# Patient Record
Sex: Female | Born: 1965 | Race: White | Hispanic: No | Marital: Married | Smoking: Former smoker
Health system: Southern US, Community
[De-identification: ages and names within clinical notes are randomized; demographics above are authoritative.]

## PROBLEM LIST (undated history)

## (undated) DIAGNOSIS — F32A Depression, unspecified: Secondary | ICD-10-CM

## (undated) DIAGNOSIS — S63642A Sprain of metacarpophalangeal joint of left thumb, initial encounter: Secondary | ICD-10-CM

## (undated) DIAGNOSIS — M549 Dorsalgia, unspecified: Secondary | ICD-10-CM

## (undated) DIAGNOSIS — K219 Gastro-esophageal reflux disease without esophagitis: Secondary | ICD-10-CM

## (undated) DIAGNOSIS — G8929 Other chronic pain: Secondary | ICD-10-CM

## (undated) DIAGNOSIS — C50911 Malignant neoplasm of unspecified site of right female breast: Secondary | ICD-10-CM

## (undated) DIAGNOSIS — F329 Major depressive disorder, single episode, unspecified: Secondary | ICD-10-CM

## (undated) HISTORY — PX: BACK SURGERY: SHX140

## (undated) HISTORY — PX: ABDOMINAL HYSTERECTOMY: SHX81

## (undated) HISTORY — PX: BREAST SURGERY: SHX581

## (undated) HISTORY — DX: Malignant neoplasm of unspecified site of right female breast: C50.911

## (undated) HISTORY — PX: TUBAL LIGATION: SHX77

---

## 2011-03-10 ENCOUNTER — Other Ambulatory Visit: Payer: Self-pay | Admitting: Oncology

## 2011-03-10 ENCOUNTER — Encounter (HOSPITAL_BASED_OUTPATIENT_CLINIC_OR_DEPARTMENT_OTHER): Payer: BC Managed Care – PPO | Admitting: Oncology

## 2011-03-10 ENCOUNTER — Encounter: Payer: Self-pay | Admitting: Oncology

## 2011-03-10 DIAGNOSIS — C50919 Malignant neoplasm of unspecified site of unspecified female breast: Secondary | ICD-10-CM

## 2011-03-10 LAB — COMPREHENSIVE METABOLIC PANEL
AST: 20 U/L (ref 0–37)
Albumin: 3.9 g/dL (ref 3.5–5.2)
Alkaline Phosphatase: 56 U/L (ref 39–117)
Glucose, Bld: 110 mg/dL — ABNORMAL HIGH (ref 70–99)
Potassium: 3.5 mEq/L (ref 3.5–5.3)
Sodium: 140 mEq/L (ref 135–145)
Total Bilirubin: 0.1 mg/dL — ABNORMAL LOW (ref 0.3–1.2)
Total Protein: 7.2 g/dL (ref 6.0–8.3)

## 2011-03-10 LAB — CBC WITH DIFFERENTIAL/PLATELET
EOS%: 1.7 % (ref 0.0–7.0)
Eosinophils Absolute: 0.1 10*3/uL (ref 0.0–0.5)
LYMPH%: 24.3 % (ref 14.0–49.7)
MCH: 29.6 pg (ref 25.1–34.0)
MCHC: 33.5 g/dL (ref 31.5–36.0)
MCV: 88.1 fL (ref 79.5–101.0)
MONO%: 6.7 % (ref 0.0–14.0)
NEUT#: 4.3 10*3/uL (ref 1.5–6.5)
Platelets: 238 10*3/uL (ref 145–400)
RBC: 4.28 10*6/uL (ref 3.70–5.45)
RDW: 13.1 % (ref 11.2–14.5)

## 2011-03-11 ENCOUNTER — Other Ambulatory Visit: Payer: Self-pay | Admitting: Oncology

## 2011-03-11 DIAGNOSIS — Z853 Personal history of malignant neoplasm of breast: Secondary | ICD-10-CM

## 2011-03-11 DIAGNOSIS — C50919 Malignant neoplasm of unspecified site of unspecified female breast: Secondary | ICD-10-CM

## 2011-03-15 ENCOUNTER — Ambulatory Visit (HOSPITAL_COMMUNITY)
Admission: RE | Admit: 2011-03-15 | Discharge: 2011-03-15 | Disposition: A | Discharge status: Home / Self Care | Payer: BC Managed Care – PPO | Source: Ambulatory Visit | Attending: Oncology | Admitting: Oncology

## 2011-03-15 DIAGNOSIS — J701 Chronic and other pulmonary manifestations due to radiation: Secondary | ICD-10-CM | POA: Insufficient documentation

## 2011-03-15 DIAGNOSIS — C50919 Malignant neoplasm of unspecified site of unspecified female breast: Secondary | ICD-10-CM | POA: Insufficient documentation

## 2011-03-15 DIAGNOSIS — K769 Liver disease, unspecified: Secondary | ICD-10-CM | POA: Insufficient documentation

## 2011-03-15 DIAGNOSIS — Y842 Radiological procedure and radiotherapy as the cause of abnormal reaction of the patient, or of later complication, without mention of misadventure at the time of the procedure: Secondary | ICD-10-CM | POA: Insufficient documentation

## 2011-03-15 MED ORDER — IOHEXOL 300 MG/ML  SOLN
80.0000 mL | Freq: Once | INTRAMUSCULAR | Status: AC | PRN
  Administered 2011-03-15: 80 mL via INTRAVENOUS

## 2011-03-18 ENCOUNTER — Other Ambulatory Visit: Payer: Self-pay | Admitting: Oncology

## 2011-03-18 ENCOUNTER — Telehealth: Payer: Self-pay | Admitting: *Deleted

## 2011-03-18 DIAGNOSIS — C50919 Malignant neoplasm of unspecified site of unspecified female breast: Secondary | ICD-10-CM

## 2011-03-22 ENCOUNTER — Encounter: Payer: Self-pay | Admitting: Physician Assistant

## 2011-03-22 ENCOUNTER — Telehealth: Payer: Self-pay | Admitting: Oncology

## 2011-03-22 ENCOUNTER — Ambulatory Visit (HOSPITAL_BASED_OUTPATIENT_CLINIC_OR_DEPARTMENT_OTHER): Payer: BC Managed Care – PPO | Admitting: Physician Assistant

## 2011-03-22 DIAGNOSIS — Z170 Estrogen receptor positive status [ER+]: Secondary | ICD-10-CM

## 2011-03-22 DIAGNOSIS — C50919 Malignant neoplasm of unspecified site of unspecified female breast: Secondary | ICD-10-CM

## 2011-03-22 DIAGNOSIS — C50911 Malignant neoplasm of unspecified site of right female breast: Secondary | ICD-10-CM

## 2011-03-22 DIAGNOSIS — K7689 Other specified diseases of liver: Secondary | ICD-10-CM

## 2011-03-22 DIAGNOSIS — N200 Calculus of kidney: Secondary | ICD-10-CM | POA: Insufficient documentation

## 2011-03-22 HISTORY — DX: Malignant neoplasm of unspecified site of right female breast: C50.911

## 2011-03-22 NOTE — Telephone Encounter (Signed)
Gv pt appt for March 2013

## 2011-03-22 NOTE — Progress Notes (Signed)
Progress note dictated-CTS 

## 2011-03-23 NOTE — Progress Notes (Deleted)
CC:   Ancil Boozer, MD Nancy Marus, MD, Fax [phone]  DIAGNOSIS:  A 45 year old [place], [place] woman with a history of a T1a NX invasive mammary carcinoma of the right breast, S/P right lumpectomy February of 2012 which revealed an ER/PR positive, HER- 2/neu negative, nuclear grade 2, invasive ductal carcinoma process.  SUBJECTIVE:  She completed radiation therapy in May of 2012 and has been on tamoxifen since.  She was seen by Dr. Pierce Crane on 03/10/2011 for initial establishment of oncologic followup. Due to the fact that he did note some deep palpable nodes within the left supraclavicular fossa, a CT of the chest was obtained which did not reveal any pathology in this particular region, but she was noted to have a 4.2 x 3.4 cm slightly hyperattenuating lesion within the left hepatic lobe.  It was felt to be somewhat exophytic with a central area of low attenuation suggesting a scar, but it was incompletely imaged.  She is scheduled for an MRI of the abdomen on 03/24/2011.  Of note, she is also scheduled for a mammogram on 04/01/2011.  She has appropriate anxiety associated with the upcoming imaging studies but has declined Ativan prescription.  She has had no new medication changes.  REVIEW OF SYSTEMS:  Otherwise negative.  ALLERGIES:  NOTED AS PENICILLIN AND SULFA.  MEDICATIONS:  Medication list has been reviewed and updated.  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  PHYSICAL EXAMINATION:  Vital Signs:  Blood pressure is 114/81, pulse 116, respirations 20, temperature 98.4, weight 190 pounds.  Full exam has been deferred.  LABORATORY DATA:  From 03/10/2011, hemoglobin 12.7 g, platelet count 238,000, WBC 6500.  Chemistry panel within normal limits including a CA 27.29 at 17.  IMPRESSION: 1. A  45 year old [place], [place] woman with a history of an     ER/PR positive, HER-2/neu negative, nuclear grade 2, T1a NX     infiltrating ductal carcinoma of the right breast for which she     underwent a right lumpectomy followed by radiation therapy     completed May of 2012 on tamoxifen 20 mg p.o. daily since. 2. Questionable finding on the left hepatic lobe suggesting scar     versus other process.  Upcoming MRI of the abdomen scheduled for     03/24/2011. The case has been reviewed with Dr. Donnie Coffin.  PLAN:  Caryssa will keep her appointment as scheduled for her MRI of the abdomen.  From our standpoint, if this is clear, we would plan on seeing her back in 4 months' time for routine followup, including a CBC, C-Met, LDH and CA 27.29.  The patient understands and agrees with this plan.    ______________________________ Sharyl Nimrod, PA CS/MEDQ  D:  03/22/2011  T:  03/22/2011  Job:  [id number]

## 2011-03-24 ENCOUNTER — Encounter: Payer: Self-pay | Admitting: Oncology

## 2011-03-24 ENCOUNTER — Ambulatory Visit (HOSPITAL_COMMUNITY)
Admission: RE | Admit: 2011-03-24 | Discharge: 2011-03-24 | Disposition: A | Discharge status: Home / Self Care | Payer: BC Managed Care – PPO | Source: Ambulatory Visit | Attending: Oncology | Admitting: Oncology

## 2011-03-24 ENCOUNTER — Telehealth: Payer: Self-pay | Admitting: Oncology

## 2011-03-24 DIAGNOSIS — K7689 Other specified diseases of liver: Secondary | ICD-10-CM | POA: Insufficient documentation

## 2011-03-24 DIAGNOSIS — C50919 Malignant neoplasm of unspecified site of unspecified female breast: Secondary | ICD-10-CM | POA: Insufficient documentation

## 2011-03-24 MED ORDER — GADOXETATE DISODIUM 0.25 MMOL/ML IV SOLN
8.0000 mL | Freq: Once | INTRAVENOUS | Status: AC | PRN
  Administered 2011-03-24: 8 mL via INTRAVENOUS

## 2011-03-24 NOTE — Telephone Encounter (Signed)
Pt called wanting results of MRI  RN informed pt results are negative per Sharyl Nimrod PA

## 2011-03-26 NOTE — Progress Notes (Deleted)
CC:   Donna Alm, MD W. Edward Wengler, MD, Fax [phone]  DIAGNOSIS:  A 45-year-old [place], [place] woman with a history of a T1a NX invasive mammary carcinoma of the right breast, S/P right lumpectomy February of 2012 which revealed an ER/PR positive, HER- 2/neu negative, nuclear grade 2, invasive ductal carcinoma process.  SUBJECTIVE:  She completed radiation therapy in May of 2012 and has been on tamoxifen since.  She was seen by Dr. Peter Garrison on 03/10/2011 for initial establishment of oncologic followup. Due to the fact that he did note some deep palpable nodes within the left supraclavicular fossa, a CT of the chest was obtained which did not reveal any pathology in this particular region, but she was noted to have a 4.2 x 3.4 cm slightly hyperattenuating lesion within the left hepatic lobe.  It was felt to be somewhat exophytic with a central area of low attenuation suggesting a scar, but it was incompletely imaged.  She is scheduled for an MRI of the abdomen on 03/24/2011.  Of note, she is also scheduled for a mammogram on 04/01/2011.  She has appropriate anxiety associated with the upcoming imaging studies but has declined Ativan prescription.  She has had no new medication changes.  REVIEW OF SYSTEMS:  Otherwise negative.  ALLERGIES:  NOTED AS PENICILLIN AND SULFA.  MEDICATIONS:  Medication list has been reviewed and updated.  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  PHYSICAL EXAMINATION:  Vital Signs:  Blood pressure is 114/81, pulse 116, respirations 20, temperature 98.4, weight 190 pounds.  Full exam has been deferred.  LABORATORY DATA:  From 03/10/2011, hemoglobin 12.7 g, platelet count 238,000, WBC 6500.  Chemistry panel within normal limits including a CA 27.29 at 17.  IMPRESSION: 1. A  45-year-old [place], [place] woman with a history of an     ER/PR positive, HER-2/neu negative, nuclear grade 2, T1a NX     infiltrating ductal carcinoma of the right breast for which she     underwent a right lumpectomy followed by radiation therapy     completed May of 2012 on tamoxifen 20 mg p.o. daily since. 2. Questionable finding on the left hepatic lobe suggesting scar     versus other process.  Upcoming MRI of the abdomen scheduled for     03/24/2011. The case has been reviewed with Dr. Rubin.  PLAN:  Donna Garrison will keep her appointment as scheduled for her MRI of the abdomen.  From our standpoint, if this is clear, we would plan on seeing her back in 4 months' time for routine followup, including a CBC, C-Met, LDH and CA 27.29.  The patient understands and agrees with this plan.    ______________________________ Donna Donald, PA CS/MEDQ  D:  03/22/2011  T:  03/22/2011  Job:  [id number] 

## 2011-03-28 ENCOUNTER — Encounter: Payer: Self-pay | Admitting: *Deleted

## 2011-03-28 NOTE — Progress Notes (Signed)
Mailed after appt letter to pt. 

## 2011-03-29 NOTE — Progress Notes (Signed)
CC:   Stephanie Alm, MD W. Edward Wengler, MD, Fax [phone]  DIAGNOSIS:  A 45-year-old Donna Garrison, Donna Garrison with a history of a T1a NX invasive mammary carcinoma of the right breast, S/P right lumpectomy February of 2012 which revealed an ER/PR positive, HER- 2/neu negative, nuclear grade 2, invasive ductal carcinoma process.  SUBJECTIVE:  She completed radiation therapy in May of 2012 and has been on tamoxifen since.  She was seen by Dr. Peter Rubin on 03/10/2011 for initial establishment of oncologic followup. Due to the fact that he did note some deep palpable nodes within the left supraclavicular fossa, a CT of the chest was obtained which did not reveal any pathology in this particular region, but she was noted to have a 4.2 x 3.4 cm slightly hyperattenuating lesion within the left hepatic lobe.  It was felt to be somewhat exophytic with a central area of low attenuation suggesting a scar, but it was incompletely imaged.  She is scheduled for an MRI of the abdomen on 03/24/2011.  Of note, she is also scheduled for a mammogram on 04/01/2011.  She has appropriate anxiety associated with the upcoming imaging studies but has declined Ativan prescription.  She has had no new medication changes.  REVIEW OF SYSTEMS:  Otherwise negative.  ALLERGIES:  NOTED AS PENICILLIN AND SULFA.  MEDICATIONS:  Medication list has been reviewed and updated.  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  PHYSICAL EXAMINATION:  Vital Signs:  Blood pressure is 114/81, pulse 116, respirations 20, temperature 98.4, weight 190 pounds.  Full exam has been deferred.  LABORATORY DATA:  From 03/10/2011, hemoglobin 12.7 g, platelet count 238,000, WBC 6500.  Chemistry panel within normal limits including a CA 27.29 at 17.  IMPRESSION: 1. A  45-year-old , Donna Garrison with a history of an     ER/PR positive, HER-2/neu negative, nuclear grade 2, T1a NX     infiltrating ductal carcinoma of the right breast for which she     underwent a right lumpectomy followed by radiation therapy     completed May of 2012 on tamoxifen 20 mg p.o. daily since. 2. Questionable finding on the left hepatic lobe suggesting scar     versus other process.  Upcoming MRI of the abdomen scheduled for     03/24/2011. The case has been reviewed with Dr. Rubin.  PLAN:  Lititia will keep her appointment as scheduled for her MRI of the abdomen.  From our standpoint, if this is clear, we would plan on seeing her back in 4 months' time for routine followup, including a CBC, C-Met, LDH and CA 27.29.  The patient understands and agrees with this plan.    ______________________________ Arianne Klinge, PA CS/MEDQ  D:  03/22/2011  T:  03/22/2011  Job:  [id number] 

## 2011-04-01 ENCOUNTER — Ambulatory Visit
Admission: RE | Admit: 2011-04-01 | Discharge: 2011-04-01 | Disposition: A | Discharge status: Home / Self Care | Payer: BC Managed Care – PPO | Source: Ambulatory Visit | Attending: Oncology | Admitting: Oncology

## 2011-04-01 DIAGNOSIS — Z853 Personal history of malignant neoplasm of breast: Secondary | ICD-10-CM

## 2011-04-02 ENCOUNTER — Telehealth: Payer: Self-pay | Admitting: *Deleted

## 2011-04-02 NOTE — Telephone Encounter (Signed)
Pt called and left msg that [facility] needed her actual films and she believed Dr Donnie Coffin had them.  Left msg that Dr Donnie Coffin has never had the actual films from her mammogram and she may need to contact the clinic where she had her last mammogram for further instructions

## 2011-05-07 ENCOUNTER — Other Ambulatory Visit (HOSPITAL_COMMUNITY)
Admission: RE | Admit: 2011-05-07 | Discharge: 2011-05-07 | Disposition: A | Discharge status: Home / Self Care | Payer: BC Managed Care – PPO | Source: Ambulatory Visit | Attending: Family Medicine | Admitting: Family Medicine

## 2011-05-07 DIAGNOSIS — Z124 Encounter for screening for malignant neoplasm of cervix: Secondary | ICD-10-CM | POA: Insufficient documentation

## 2011-05-07 DIAGNOSIS — Z1159 Encounter for screening for other viral diseases: Secondary | ICD-10-CM | POA: Insufficient documentation

## 2011-07-20 ENCOUNTER — Other Ambulatory Visit: Payer: BC Managed Care – PPO | Admitting: Lab

## 2011-07-20 ENCOUNTER — Ambulatory Visit: Payer: BC Managed Care – PPO | Admitting: Oncology

## 2012-02-23 ENCOUNTER — Other Ambulatory Visit: Payer: Self-pay | Admitting: *Deleted

## 2012-02-23 MED ORDER — TAMOXIFEN CITRATE 20 MG PO TABS: 20.0000 mg | ORAL_TABLET | Freq: Every day | ORAL | Status: DC

## 2012-02-23 NOTE — Telephone Encounter (Signed)
Pt requests refill on tamoxifen for 1 month until she sees her her new provider 03/15/12 near her home in [place]. A 1 month refill has been called into [facility] in  [place],

## 2012-08-26 ENCOUNTER — Emergency Department (HOSPITAL_COMMUNITY)
Admission: EM | Admit: 2012-08-26 | Discharge: 2012-08-26 | Disposition: A | Discharge status: Home / Self Care | Payer: Private Health Insurance - Indemnity | Attending: Emergency Medicine | Admitting: Emergency Medicine

## 2012-08-26 ENCOUNTER — Encounter (HOSPITAL_COMMUNITY): Payer: Self-pay | Admitting: *Deleted

## 2012-08-26 ENCOUNTER — Emergency Department (HOSPITAL_COMMUNITY): Payer: Private Health Insurance - Indemnity

## 2012-08-26 DIAGNOSIS — S3991XA Unspecified injury of abdomen, initial encounter: Secondary | ICD-10-CM

## 2012-08-26 DIAGNOSIS — S3981XA Other specified injuries of abdomen, initial encounter: Secondary | ICD-10-CM | POA: Insufficient documentation

## 2012-08-26 DIAGNOSIS — Z79899 Other long term (current) drug therapy: Secondary | ICD-10-CM | POA: Insufficient documentation

## 2012-08-26 DIAGNOSIS — Y929 Unspecified place or not applicable: Secondary | ICD-10-CM | POA: Insufficient documentation

## 2012-08-26 DIAGNOSIS — Z853 Personal history of malignant neoplasm of breast: Secondary | ICD-10-CM | POA: Insufficient documentation

## 2012-08-26 DIAGNOSIS — W64XXXA Exposure to other animate mechanical forces, initial encounter: Secondary | ICD-10-CM | POA: Insufficient documentation

## 2012-08-26 DIAGNOSIS — Y939 Activity, unspecified: Secondary | ICD-10-CM | POA: Insufficient documentation

## 2012-08-26 LAB — COMPREHENSIVE METABOLIC PANEL
AST: 27 U/L (ref 0–37)
Albumin: 4.3 g/dL (ref 3.5–5.2)
Chloride: 101 mEq/L (ref 96–112)
Creatinine, Ser: 1.03 mg/dL (ref 0.50–1.10)
Total Bilirubin: 0.2 mg/dL — ABNORMAL LOW (ref 0.3–1.2)
Total Protein: 7.5 g/dL (ref 6.0–8.3)

## 2012-08-26 LAB — CBC
MCV: 88.2 fL (ref 78.0–100.0)
Platelets: 235 10*3/uL (ref 150–400)
RDW: 13.1 % (ref 11.5–15.5)
WBC: 10.2 10*3/uL (ref 4.0–10.5)

## 2012-08-26 MED ORDER — ONDANSETRON 8 MG PO TBDP: 8.0000 mg | ORAL_TABLET | Freq: Three times a day (TID) | ORAL | Status: DC | PRN

## 2012-08-26 MED ORDER — MORPHINE SULFATE 4 MG/ML IJ SOLN: 6.0000 mg | Freq: Once | INTRAMUSCULAR | Status: DC

## 2012-08-26 MED ORDER — IOHEXOL 300 MG/ML  SOLN
100.0000 mL | Freq: Once | INTRAMUSCULAR | Status: AC | PRN
  Administered 2012-08-26: 100 mL via INTRAVENOUS

## 2012-08-26 MED ORDER — HYDROMORPHONE HCL PF 1 MG/ML IJ SOLN
1.0000 mg | Freq: Once | INTRAMUSCULAR | Status: AC
  Administered 2012-08-26: 1 mg via INTRAVENOUS
  Filled 2012-08-26: qty 1

## 2012-08-26 MED ORDER — OXYCODONE-ACETAMINOPHEN 5-325 MG PO TABS
1.0000 | ORAL_TABLET | ORAL | Status: DC | PRN
Start: 2012-08-26 — End: 2017-07-05

## 2012-08-26 NOTE — ED Notes (Signed)
Pt states she was kicked in the stomach by a horse. C/o rib pain denies any other

## 2012-08-26 NOTE — ED Notes (Signed)
Pt given glass of water, dr Patria Mane states OK

## 2012-08-26 NOTE — ED Notes (Signed)
Pt was kicked in upper abdomen and lower ribs by a horse approx 1 hour ago. Complains of severe abd pain, slight mark noted to abdomen, very tender to palpation, no obvious distension noted. Pt waiting on CT at this time. AAx4

## 2012-08-26 NOTE — ED Notes (Signed)
Pt states pain is back at this time, 1mg  dilaudid per verbal order by dr Patria Mane.

## 2012-08-26 NOTE — ED Provider Notes (Signed)
History     CSN: [id number]  Arrival date & time 08/26/12  2026   First MD Initiated Contact with Patient 08/26/12 2033      Chief Complaint  Patient presents with  . Rib Injury     HPI Patient reports she was kicked in the upper abdomen by her horse about 30-40 minutes ago.  She reports some discomfort in her chest however the majority of pain is located in her upper abdomen.  No nausea or vomiting.  Her pain at this time is severe and seems to be worsening.  No lightheadedness or weakness.  No anticoagulant use.  No shortness of breath.    Past Medical History  Diagnosis Date  . Breast cancer, right breast 03/22/2011    Past Surgical History  Procedure Laterality Date  . Back surgery      Lumbar Laminectomy L5-S1 2008    Family History  Problem Relation Age of Onset  . Hypertension Mother   . Celiac disease Father   . Celiac disease Sister     History  Substance Use Topics  . Smoking status: Never Smoker   . Smokeless tobacco: Not on file  . Alcohol Use: Yes     Comment: occasional    OB History   Grav Para Term Preterm Abortions TAB SAB Ect Mult Living                  Review of Systems  All other systems reviewed and are negative.    Allergies  Penicillins and Sulfa antibiotics  Home Medications   Current Outpatient Rx  Name  Route  Sig  Dispense  Refill  . cyclobenzaprine (FLEXERIL) 10 MG tablet   Oral   Take 10 mg by mouth 3 (three) times daily as needed.           . diclofenac (VOLTAREN) 0.1 % ophthalmic solution      1 drop 4 (four) times daily. Voltaren XR 100mg  po PRN          . DULoxetine (CYMBALTA) 60 MG capsule   Oral   Take 60 mg by mouth daily.           Marland Kitchen loratadine (CLARITIN) 10 MG tablet   Oral   Take 10 mg by mouth daily.           . metaxalone (SKELAXIN) 800 MG tablet   Oral   Take 800 mg by mouth 3 (three) times daily as needed.           . tamoxifen (NOLVADEX) 20 MG tablet   Oral   Take 1 tablet (20 mg  total) by mouth daily.   30 tablet   0     BP 137/89  Pulse 97  Temp(Src) 97.5 F (36.4 C) (Oral)  Resp 20  Ht 5\' 6"  (1.676 m)  Wt 190 lb (86.183 kg)  BMI 30.68 kg/m2  SpO2 100%  LMP 08/17/2012  Physical Exam  Nursing note and vitals reviewed. Constitutional: She is oriented to person, place, and time. She appears well-developed and well-nourished. No distress.  HENT:  Head: Normocephalic and atraumatic.  Eyes: EOM are normal.  Neck: Normal range of motion.  Cardiovascular: Normal rate, regular rhythm and normal heart sounds.   Pulmonary/Chest: Effort normal and breath sounds normal.  Abdominal: Soft. She exhibits no distension.  Epigastric and upper abdominal tenderness without rebound.  Patient does have some guarding.  Musculoskeletal: Normal range of motion.  Neurological: She is alert and oriented  to person, place, and time.  Skin: Skin is warm and dry.  Psychiatric: She has a normal mood and affect. Judgment normal.    ED Course  Procedures (including critical care time)  Labs Reviewed  COMPREHENSIVE METABOLIC PANEL - Abnormal; Notable for the following:    Total Bilirubin 0.2 (*)    GFR calc non Af Amer 64 (*)    GFR calc Af Amer 74 (*)    All other components within normal limits  CBC  LIPASE, BLOOD   Ct Abdomen Pelvis W Contrast  08/26/2012  *RADIOLOGY REPORT*  Clinical Data: 46 year old female with sternal pain after being kicked by horse.  Rib injury.  History of right side breast cancer.  CT ABDOMEN AND PELVIS WITH CONTRAST  Technique:  Multidetector CT imaging of the abdomen and pelvis was performed following the standard protocol during bolus administration of intravenous contrast.  Contrast: OMNIPAQUE IOHEXOL 300 MG/ML  SOLN  Comparison: Abdomen MRI 03/24/2011.  Findings: No pneumothorax or pleural effusion.  No pericardial effusion.  Normal lung volumes; minimal dependent atelectasis.  No confluent opacities to suggest pulmonary contusion.   Visualized lower ribs appear intact.  There is a subtle area of subcutaneous fat stranding in the midline on series 2 image 20 at the anterior level of the diaphragm compatible with a post traumatic contusion.  Tip of the sternum and xyphoid appear within normal limits.  No acute osseous abnormality identified.  Chronic L5-S1 disc degeneration with vacuum disc phenomena.  No pelvic free fluid.  Negative rectum.  Pelvic phleboliths. Uterus and adnexa within normal limits.  Diminutive bladder. Redundant sigmoid colon.  Redundant descending colon and splenic flexure.  Transverse and right colon within normal limits.  Normal appendix.  Negative terminal ileum.  No dilated small bowel.  No pneumoperitoneum.  Negative stomach and duodenum.  Stable liver with 4 cm diameter largely iso- dense area adjacent to the gallbladder fossa felt to reflect benign S and a H on prior study (series 2 image 24).  No perihepatic fluid.  No evidence of liver laceration / injury.  Negative gallbladder, spleen, pancreas, adrenal glands, and portal venous system.  Major arterial structures in the abdomen and pelvis appear normal.  No abdominal free fluid.  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  No abdominal lymphadenopathy.  IMPRESSION: 1.  Mild ventral abdominal contusion in the midline below the xyphoid.  No rib fracture, traumatic injury to the lower chest, or visceral injury in the abdomen or pelvis identified. 2.  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   Original Report Authenticated By: Erskine Speed, M.D.    Dg Chest Portable 1 View  08/26/2012  *RADIOLOGY REPORT*  Clinical Data: 48 year old female status post blunt trauma.  Rib pain.  History of right breast cancer.  PORTABLE CHEST - 1 VIEW  Comparison: Chest CT 03/15/2011.  Findings: Portable AP upright view 2043 hours.  Lower lung volumes. No pneumothorax or pneumoperitoneum evident.  Cardiac size and mediastinal contours are  within normal limits.  Visualized tracheal air column is within normal limits.  Surgical clips at the right axilla.  No pleural effusion, pulmonary contusion or confluent pulmonary opacity identified.  No displaced rib fracture identified.  IMPRESSION: No acute cardiopulmonary abnormality or acute traumatic injury identified.   Original Report Authenticated By: Erskine Speed, M.D.    I personally reviewed the imaging tests through PACS system I reviewed available ER/hospitalization records through the EMR   No diagnosis  found.    MDM  8:44 PM Patient will undergo portable chest and a stat CT abdomen and pelvis to evaluate for solid organ injury.  Vitals stable this time.  N.p.o.  Pain treated.  10:03 PM Patient feels much better at this time.  CT scan is normal.  No free fluid or anything else to suggest small bowel injury.  Specifically read as no visceral injury.  I talked with the patient and her husband at length regarding her abdominal pain.  Seems to significantly be improved at this time.  I will send the patient home with some pain medicine and some nausea medicine.  She's not nauseated this time.  She understands return to the ER for new or worsening symptoms.  She was told and understands that there could be the potential for a very small a cold bowel injury they were not noticing at this time and that if her symptoms were to worsen she will need to return immediately to the ER.  She understands        Lyanne Co, MD 08/26/12 2204

## 2016-09-19 ENCOUNTER — Emergency Department (HOSPITAL_COMMUNITY)
Admission: EM | Admit: 2016-09-19 | Discharge: 2016-09-19 | Disposition: A | Discharge status: Home / Self Care | Payer: BLUE CROSS/BLUE SHIELD | Attending: Emergency Medicine | Admitting: Emergency Medicine

## 2016-09-19 ENCOUNTER — Emergency Department (HOSPITAL_COMMUNITY): Payer: BLUE CROSS/BLUE SHIELD

## 2016-09-19 ENCOUNTER — Encounter (HOSPITAL_COMMUNITY): Payer: Self-pay | Admitting: Emergency Medicine

## 2016-09-19 DIAGNOSIS — M545 Low back pain, unspecified: Secondary | ICD-10-CM

## 2016-09-19 DIAGNOSIS — Y929 Unspecified place or not applicable: Secondary | ICD-10-CM | POA: Diagnosis not present

## 2016-09-19 DIAGNOSIS — F1290 Cannabis use, unspecified, uncomplicated: Secondary | ICD-10-CM | POA: Insufficient documentation

## 2016-09-19 DIAGNOSIS — Z853 Personal history of malignant neoplasm of breast: Secondary | ICD-10-CM | POA: Insufficient documentation

## 2016-09-19 DIAGNOSIS — Y939 Activity, unspecified: Secondary | ICD-10-CM | POA: Diagnosis not present

## 2016-09-19 DIAGNOSIS — Z79899 Other long term (current) drug therapy: Secondary | ICD-10-CM | POA: Diagnosis not present

## 2016-09-19 DIAGNOSIS — Y999 Unspecified external cause status: Secondary | ICD-10-CM | POA: Diagnosis not present

## 2016-09-19 DIAGNOSIS — S7012XA Contusion of left thigh, initial encounter: Secondary | ICD-10-CM | POA: Diagnosis not present

## 2016-09-19 DIAGNOSIS — Z87891 Personal history of nicotine dependence: Secondary | ICD-10-CM | POA: Insufficient documentation

## 2016-09-19 DIAGNOSIS — W19XXXA Unspecified fall, initial encounter: Secondary | ICD-10-CM

## 2016-09-19 DIAGNOSIS — S60212A Contusion of left wrist, initial encounter: Secondary | ICD-10-CM | POA: Insufficient documentation

## 2016-09-19 DIAGNOSIS — S3992XA Unspecified injury of lower back, initial encounter: Secondary | ICD-10-CM | POA: Diagnosis present

## 2016-09-19 HISTORY — DX: Depression, unspecified: F32.A

## 2016-09-19 HISTORY — DX: Major depressive disorder, single episode, unspecified: F32.9

## 2016-09-19 MED ORDER — METHOCARBAMOL 500 MG PO TABS: 500.0000 mg | ORAL_TABLET | Freq: Two times a day (BID) | ORAL | 0 refills | Status: DC | PRN

## 2016-09-19 MED ORDER — ONDANSETRON HCL 4 MG/2ML IJ SOLN
4.0000 mg | Freq: Once | INTRAMUSCULAR | Status: AC
  Administered 2016-09-19: 4 mg via INTRAVENOUS
  Filled 2016-09-19: qty 2

## 2016-09-19 MED ORDER — HYDROMORPHONE HCL 1 MG/ML IJ SOLN
1.0000 mg | Freq: Once | INTRAMUSCULAR | Status: AC
  Administered 2016-09-19: 1 mg via INTRAVENOUS
  Filled 2016-09-19: qty 1

## 2016-09-19 MED ORDER — IBUPROFEN 800 MG PO TABS: 800.0000 mg | ORAL_TABLET | Freq: Three times a day (TID) | ORAL | 0 refills | Status: DC

## 2016-09-19 NOTE — ED Provider Notes (Signed)
[facility] DEPT Provider Note   CSN: [id number] Arrival date & time: 09/19/16  0402     History   Chief Complaint Chief Complaint  Patient presents with  . Fall    HPI Donna Garrison is a 51 y.o. female.  Patient with severe right hip and low back pain after being thrown from horse around 8 PM last night. Denies losing consciousness but may have hit head. She does not take any blood thinners. History of back surgery remotely. Complains of severe midline low back pain and pain in her right hip. Took Percocet at home with no relief. Denies any numbness or tingling. Denies any incontinence. Denies any focal weakness. Denies any neck pain, chest pain, abdominal pain or shortness of breath. He was able to ambulate with assistance. Pain is severe in the midline of her back as well as across her pelvis and right hip.   The history is provided by the patient and a relative.  Fall  Pertinent negatives include no chest pain, no abdominal pain, no headaches and no shortness of breath.    Past Medical History:  Diagnosis Date  . Breast cancer, right breast ([place]) 03/22/2011  . Depression     Patient Active Problem List   Diagnosis Date Noted  . Breast cancer, right breast ([place]) 03/22/2011  . Renal calculi 03/22/2011    Past Surgical History:  Procedure Laterality Date  . ABDOMINAL HYSTERECTOMY    . BACK SURGERY     Lumbar Laminectomy L5-S1 2008    OB History    No data available       Home Medications    Prior to Admission medications   Medication Sig Start Date End Date Taking? Authorizing Provider  DULoxetine (CYMBALTA) 60 MG capsule Take 60 mg by mouth daily.     Yes [provider]  fexofenadine (ALLEGRA) 30 MG tablet Take 30 mg by mouth 2 (two) times daily.   Yes [provider]  oxyCODONE-acetaminophen (PERCOCET/ROXICET) 5-325 MG per tablet Take 1 tablet by mouth every 4 (four) hours as needed for pain. 08/26/12  Yes Jola Schmidt, MD  loratadine  (CLARITIN) 10 MG tablet Take 10 mg by mouth daily.      [provider]  ondansetron (ZOFRAN ODT) 8 MG disintegrating tablet Take 1 tablet (8 mg total) by mouth every 8 (eight) hours as needed for nausea. 08/26/12   Jola Schmidt, MD  spironolactone (ALDACTONE) 100 MG tablet Take 100 mg by mouth daily.    [provider]  tamoxifen (NOLVADEX) 20 MG tablet Take 1 tablet (20 mg total) by mouth daily. 02/23/12   Eston Esters, MD    Family History Family History  Problem Relation Age of Onset  . Hypertension Mother   . Celiac disease Father   . Celiac disease Sister     Social History Social History  Substance Use Topics  . Smoking status: Former Smoker    Quit date: 05/17/2000  . Smokeless tobacco: Never Used  . Alcohol use Yes     Comment: occasional     Allergies   Penicillins and Sulfa antibiotics   Review of Systems Review of Systems  Constitutional: Negative for activity change, appetite change and fever.  HENT: Negative for congestion and rhinorrhea.   Respiratory: Negative for cough, chest tightness and shortness of breath.   Cardiovascular: Negative for chest pain.  Gastrointestinal: Negative for abdominal pain, nausea and vomiting.  Genitourinary: Negative for dysuria and hematuria.  Musculoskeletal: Positive for arthralgias, back  pain and myalgias. Negative for neck pain.  Skin: Negative for wound.  Neurological: Negative for dizziness, weakness and headaches.    all other systems are negative except as noted in the HPI and PMH.   Physical Exam Updated Vital Signs BP 126/87   Pulse (!) 105   Temp 98.1 F (36.7 C) (Oral)   Resp 20   Ht 5\' 7"  (1.702 m)   Wt 200 lb (90.7 kg)   SpO2 100%   BMI 31.32 kg/m   Physical Exam  Constitutional: She is oriented to person, place, and time. She appears well-developed and well-nourished. No distress.  uncomfortable  HENT:  Head: Normocephalic and atraumatic.  Mouth/Throat: Oropharynx is clear and  moist. No oropharyngeal exudate.  Eyes: Conjunctivae and EOM are normal. Pupils are equal, round, and reactive to light.  Neck: Normal range of motion. Neck supple.  No C spine tenderness  Cardiovascular: Normal rate, regular rhythm, normal heart sounds and intact distal pulses.   No murmur heard. Pulmonary/Chest: Effort normal and breath sounds normal. No respiratory distress. She exhibits no tenderness.  Abdominal: Soft. There is no tenderness. There is no rebound and no guarding.  Musculoskeletal: Normal range of motion. She exhibits tenderness. She exhibits no deformity.  Midline lumbar tenderness, no stepoff Pain with ROM R hip. No shortening or external rotation. Ecchymosis L medial thigh.  Ecchymosis L palmar wrist without bony tenderness  Neurological: She is alert and oriented to person, place, and time. No cranial nerve deficit. She exhibits normal muscle tone. Coordination normal.   5/5 strength throughout. CN 2-12 intact.Equal grip strength.   Skin: Skin is warm.  Psychiatric: She has a normal mood and affect. Her behavior is normal.  Nursing note and vitals reviewed.    ED Treatments / Results  Labs (all labs ordered are listed, but only abnormal results are displayed) Labs Reviewed - No data to display  EKG  EKG Interpretation None       Radiology No results found.  Procedures Procedures (including critical care time)  Medications Ordered in ED Medications  HYDROmorphone (DILAUDID) injection 1 mg (not administered)  ondansetron (ZOFRAN) injection 4 mg (not administered)     Initial Impression / Assessment and Plan / ED Course  I have reviewed the triage vital signs and the nursing notes.  Pertinent labs & imaging results that were available during my care of the patient were reviewed by me and considered in my medical decision making (see chart for details).     Fall from horse with Low back and R hip pain.  No LOC.  No focal weakness, numbness,  tingling. No incontinence.    Xrays negative. Will proceed with CT imaging of pelvis and lumbar spine.  Pain meds repeated. Pain improving.  Normal strength and sensation in lower extremities.  CT lumbar spine and pelvis pending. Dr. Sabra Heck to assume care at shift change. Final Clinical Impressions(s) / ED Diagnoses   Final diagnoses:  Fall, initial encounter  Acute low back pain without sciatica, unspecified back pain laterality    New Prescriptions New Prescriptions   No medications on file     Ezequiel Essex, MD 09/19/16 2039

## 2016-09-19 NOTE — ED Triage Notes (Signed)
PT was thrown from horse around 2000 on 09/18/2016.  Having severe pain in lower back and right hip

## 2016-09-19 NOTE — Discharge Instructions (Signed)
Bedrest for no more than 48 hours Gradually mobilize Motrin 800mg  every 8 hours as needed for pain Robaxin for muscle relaxant See your doctor for recheck if no better in 1 week.

## 2016-09-19 NOTE — ED Provider Notes (Signed)
Pt doing better - informed of results - stable for d/c Expressed understanding to results and treatment plan Both she and husband made aware Copy of CT results given to family.   Noemi Chapel, MD 09/19/16 [phone]

## 2016-10-04 ENCOUNTER — Other Ambulatory Visit (HOSPITAL_COMMUNITY): Payer: Self-pay | Admitting: Physician Assistant

## 2016-10-04 ENCOUNTER — Ambulatory Visit (HOSPITAL_COMMUNITY)
Admission: RE | Admit: 2016-10-04 | Discharge: 2016-10-04 | Disposition: A | Discharge status: Home / Self Care | Payer: BLUE CROSS/BLUE SHIELD | Source: Ambulatory Visit | Attending: Physician Assistant | Admitting: Physician Assistant

## 2016-10-04 DIAGNOSIS — M25862 Other specified joint disorders, left knee: Secondary | ICD-10-CM | POA: Insufficient documentation

## 2016-10-04 DIAGNOSIS — M7989 Other specified soft tissue disorders: Secondary | ICD-10-CM | POA: Insufficient documentation

## 2016-10-04 DIAGNOSIS — M79605 Pain in left leg: Secondary | ICD-10-CM | POA: Insufficient documentation

## 2016-10-12 ENCOUNTER — Other Ambulatory Visit (HOSPITAL_COMMUNITY): Payer: Self-pay | Admitting: Internal Medicine

## 2016-10-12 DIAGNOSIS — M25562 Pain in left knee: Secondary | ICD-10-CM

## 2016-10-15 ENCOUNTER — Ambulatory Visit (HOSPITAL_COMMUNITY)
Admission: RE | Admit: 2016-10-15 | Discharge: 2016-10-15 | Disposition: A | Discharge status: Home / Self Care | Payer: BLUE CROSS/BLUE SHIELD | Source: Ambulatory Visit | Attending: Internal Medicine | Admitting: Internal Medicine

## 2016-10-15 DIAGNOSIS — X58XXXA Exposure to other specified factors, initial encounter: Secondary | ICD-10-CM | POA: Diagnosis not present

## 2016-10-15 DIAGNOSIS — M25562 Pain in left knee: Secondary | ICD-10-CM | POA: Insufficient documentation

## 2016-10-15 DIAGNOSIS — S8002XA Contusion of left knee, initial encounter: Secondary | ICD-10-CM | POA: Diagnosis not present

## 2017-06-23 ENCOUNTER — Other Ambulatory Visit (HOSPITAL_COMMUNITY): Payer: Self-pay | Admitting: Physician Assistant

## 2017-06-23 DIAGNOSIS — S6982XA Other specified injuries of left wrist, hand and finger(s), initial encounter: Secondary | ICD-10-CM

## 2017-06-28 ENCOUNTER — Ambulatory Visit (HOSPITAL_COMMUNITY)
Admission: RE | Admit: 2017-06-28 | Discharge: 2017-06-28 | Disposition: A | Discharge status: Home / Self Care | Payer: BLUE CROSS/BLUE SHIELD | Source: Ambulatory Visit | Attending: Physician Assistant | Admitting: Physician Assistant

## 2017-06-28 DIAGNOSIS — S6982XA Other specified injuries of left wrist, hand and finger(s), initial encounter: Secondary | ICD-10-CM

## 2017-06-28 DIAGNOSIS — S5330XA Traumatic rupture of unspecified ulnar collateral ligament, initial encounter: Secondary | ICD-10-CM | POA: Diagnosis not present

## 2017-06-28 DIAGNOSIS — X58XXXA Exposure to other specified factors, initial encounter: Secondary | ICD-10-CM | POA: Diagnosis not present

## 2017-06-28 DIAGNOSIS — S6992XA Unspecified injury of left wrist, hand and finger(s), initial encounter: Secondary | ICD-10-CM | POA: Diagnosis present

## 2017-07-04 ENCOUNTER — Other Ambulatory Visit: Payer: Self-pay | Admitting: Orthopedic Surgery

## 2017-07-05 ENCOUNTER — Other Ambulatory Visit: Payer: Self-pay

## 2017-07-05 ENCOUNTER — Encounter (HOSPITAL_BASED_OUTPATIENT_CLINIC_OR_DEPARTMENT_OTHER): Payer: Self-pay | Admitting: *Deleted

## 2017-07-07 ENCOUNTER — Encounter (HOSPITAL_BASED_OUTPATIENT_CLINIC_OR_DEPARTMENT_OTHER)
Admission: RE | Disposition: A | Discharge status: Home / Self Care | Payer: Self-pay | Source: Ambulatory Visit | Attending: Orthopedic Surgery

## 2017-07-07 ENCOUNTER — Ambulatory Visit (HOSPITAL_BASED_OUTPATIENT_CLINIC_OR_DEPARTMENT_OTHER): Payer: BLUE CROSS/BLUE SHIELD | Admitting: Certified Registered"

## 2017-07-07 ENCOUNTER — Encounter (HOSPITAL_BASED_OUTPATIENT_CLINIC_OR_DEPARTMENT_OTHER): Payer: Self-pay | Admitting: *Deleted

## 2017-07-07 ENCOUNTER — Ambulatory Visit (HOSPITAL_BASED_OUTPATIENT_CLINIC_OR_DEPARTMENT_OTHER)
Admission: RE | Admit: 2017-07-07 | Discharge: 2017-07-07 | Disposition: A | Discharge status: Home / Self Care | Payer: BLUE CROSS/BLUE SHIELD | Source: Ambulatory Visit | Attending: Orthopedic Surgery | Admitting: Orthopedic Surgery

## 2017-07-07 DIAGNOSIS — N289 Disorder of kidney and ureter, unspecified: Secondary | ICD-10-CM | POA: Diagnosis not present

## 2017-07-07 DIAGNOSIS — K219 Gastro-esophageal reflux disease without esophagitis: Secondary | ICD-10-CM | POA: Diagnosis not present

## 2017-07-07 DIAGNOSIS — Z888 Allergy status to other drugs, medicaments and biological substances status: Secondary | ICD-10-CM | POA: Insufficient documentation

## 2017-07-07 DIAGNOSIS — Z853 Personal history of malignant neoplasm of breast: Secondary | ICD-10-CM | POA: Diagnosis not present

## 2017-07-07 DIAGNOSIS — Z880 Allergy status to penicillin: Secondary | ICD-10-CM | POA: Diagnosis not present

## 2017-07-07 DIAGNOSIS — Y9372 Activity, wrestling: Secondary | ICD-10-CM | POA: Insufficient documentation

## 2017-07-07 DIAGNOSIS — Z90710 Acquired absence of both cervix and uterus: Secondary | ICD-10-CM | POA: Diagnosis not present

## 2017-07-07 DIAGNOSIS — M549 Dorsalgia, unspecified: Secondary | ICD-10-CM | POA: Diagnosis not present

## 2017-07-07 DIAGNOSIS — S5330XA Traumatic rupture of unspecified ulnar collateral ligament, initial encounter: Secondary | ICD-10-CM | POA: Diagnosis present

## 2017-07-07 DIAGNOSIS — Z8379 Family history of other diseases of the digestive system: Secondary | ICD-10-CM | POA: Diagnosis not present

## 2017-07-07 DIAGNOSIS — G8929 Other chronic pain: Secondary | ICD-10-CM | POA: Insufficient documentation

## 2017-07-07 DIAGNOSIS — Z8249 Family history of ischemic heart disease and other diseases of the circulatory system: Secondary | ICD-10-CM | POA: Diagnosis not present

## 2017-07-07 DIAGNOSIS — Z882 Allergy status to sulfonamides status: Secondary | ICD-10-CM | POA: Diagnosis not present

## 2017-07-07 DIAGNOSIS — F329 Major depressive disorder, single episode, unspecified: Secondary | ICD-10-CM | POA: Insufficient documentation

## 2017-07-07 DIAGNOSIS — Z87891 Personal history of nicotine dependence: Secondary | ICD-10-CM | POA: Insufficient documentation

## 2017-07-07 DIAGNOSIS — S63418A Traumatic rupture of collateral ligament of other finger at metacarpophalangeal and interphalangeal joint, initial encounter: Secondary | ICD-10-CM | POA: Insufficient documentation

## 2017-07-07 DIAGNOSIS — Z79899 Other long term (current) drug therapy: Secondary | ICD-10-CM | POA: Diagnosis not present

## 2017-07-07 HISTORY — DX: Dorsalgia, unspecified: M54.9

## 2017-07-07 HISTORY — DX: Other chronic pain: G89.29

## 2017-07-07 HISTORY — DX: Gastro-esophageal reflux disease without esophagitis: K21.9

## 2017-07-07 HISTORY — PX: ULNAR COLLATERAL LIGAMENT REPAIR: SHX6159

## 2017-07-07 HISTORY — DX: Sprain of metacarpophalangeal joint of left thumb, initial encounter: S63.642A

## 2017-07-07 SURGERY — REPAIR, LIGAMENT, ULNAR COLLATERAL
Anesthesia: General | Site: Hand | Laterality: Left

## 2017-07-07 MED ORDER — LACTATED RINGERS IV SOLN: INTRAVENOUS | Status: DC

## 2017-07-07 MED ORDER — VANCOMYCIN HCL IN DEXTROSE 1-5 GM/200ML-% IV SOLN
INTRAVENOUS | Status: AC
  Filled 2017-07-07: qty 200

## 2017-07-07 MED ORDER — OXYCODONE HCL 5 MG/5ML PO SOLN: 5.0000 mg | Freq: Once | ORAL | Status: DC | PRN

## 2017-07-07 MED ORDER — ONDANSETRON HCL 4 MG/2ML IJ SOLN
INTRAMUSCULAR | Status: AC
  Filled 2017-07-07: qty 2

## 2017-07-07 MED ORDER — PROPOFOL 10 MG/ML IV BOLUS
INTRAVENOUS | Status: DC | PRN
  Administered 2017-07-07: 180 mg via INTRAVENOUS

## 2017-07-07 MED ORDER — LACTATED RINGERS IV SOLN
INTRAVENOUS | Status: DC
  Administered 2017-07-07: 08:00:00 via INTRAVENOUS

## 2017-07-07 MED ORDER — FENTANYL CITRATE (PF) 100 MCG/2ML IJ SOLN
INTRAMUSCULAR | Status: AC
  Filled 2017-07-07: qty 2

## 2017-07-07 MED ORDER — MEPERIDINE HCL 25 MG/ML IJ SOLN: 6.2500 mg | INTRAMUSCULAR | Status: DC | PRN

## 2017-07-07 MED ORDER — DIPHENHYDRAMINE HCL 50 MG/ML IJ SOLN
INTRAMUSCULAR | Status: AC
  Filled 2017-07-07: qty 1

## 2017-07-07 MED ORDER — SCOPOLAMINE 1 MG/3DAYS TD PT72: 1.0000 | MEDICATED_PATCH | Freq: Once | TRANSDERMAL | Status: DC | PRN

## 2017-07-07 MED ORDER — ONDANSETRON HCL 4 MG/2ML IJ SOLN
INTRAMUSCULAR | Status: DC | PRN
  Administered 2017-07-07: 4 mg via INTRAVENOUS

## 2017-07-07 MED ORDER — VANCOMYCIN HCL IN DEXTROSE 1-5 GM/200ML-% IV SOLN
1000.0000 mg | INTRAVENOUS | Status: AC
  Administered 2017-07-07: 1000 mg via INTRAVENOUS

## 2017-07-07 MED ORDER — HYDROMORPHONE HCL 1 MG/ML IJ SOLN
INTRAMUSCULAR | Status: AC
  Filled 2017-07-07: qty 0.5

## 2017-07-07 MED ORDER — LIDOCAINE HCL (CARDIAC) 20 MG/ML IV SOLN
INTRAVENOUS | Status: DC | PRN
  Administered 2017-07-07: 100 mg via INTRAVENOUS

## 2017-07-07 MED ORDER — BUPIVACAINE HCL (PF) 0.25 % IJ SOLN
INTRAMUSCULAR | Status: AC
  Filled 2017-07-07: qty 30

## 2017-07-07 MED ORDER — DEXAMETHASONE SODIUM PHOSPHATE 10 MG/ML IJ SOLN
INTRAMUSCULAR | Status: DC | PRN
  Administered 2017-07-07: 10 mg via INTRAVENOUS

## 2017-07-07 MED ORDER — HYDROMORPHONE HCL 1 MG/ML IJ SOLN
INTRAMUSCULAR | Status: AC
Start: 2017-07-07 — End: ?
  Filled 2017-07-07: qty 0.5

## 2017-07-07 MED ORDER — HYDROMORPHONE HCL 1 MG/ML IJ SOLN
0.2500 mg | INTRAMUSCULAR | Status: DC | PRN
  Administered 2017-07-07 (×3): 0.5 mg via INTRAVENOUS

## 2017-07-07 MED ORDER — PROPOFOL 500 MG/50ML IV EMUL
INTRAVENOUS | Status: AC
  Filled 2017-07-07: qty 50

## 2017-07-07 MED ORDER — DIPHENHYDRAMINE HCL 50 MG/ML IJ SOLN
25.0000 mg | Freq: Once | INTRAMUSCULAR | Status: AC | PRN
  Administered 2017-07-07: 25 mg via INTRAVENOUS

## 2017-07-07 MED ORDER — MIDAZOLAM HCL 2 MG/2ML IJ SOLN
INTRAMUSCULAR | Status: AC
  Filled 2017-07-07: qty 2

## 2017-07-07 MED ORDER — PROMETHAZINE HCL 25 MG/ML IJ SOLN: 6.2500 mg | INTRAMUSCULAR | Status: DC | PRN

## 2017-07-07 MED ORDER — CHLORHEXIDINE GLUCONATE 4 % EX LIQD: 60.0000 mL | Freq: Once | CUTANEOUS | Status: DC

## 2017-07-07 MED ORDER — BUPIVACAINE HCL (PF) 0.25 % IJ SOLN
INTRAMUSCULAR | Status: DC | PRN
  Administered 2017-07-07: 8 mL

## 2017-07-07 MED ORDER — FENTANYL CITRATE (PF) 100 MCG/2ML IJ SOLN
50.0000 ug | INTRAMUSCULAR | Status: DC | PRN
  Administered 2017-07-07 (×2): 50 ug via INTRAVENOUS

## 2017-07-07 MED ORDER — HYDROCODONE-ACETAMINOPHEN 5-325 MG PO TABS: ORAL_TABLET | ORAL | 0 refills | Status: DC

## 2017-07-07 MED ORDER — MIDAZOLAM HCL 2 MG/2ML IJ SOLN
1.0000 mg | INTRAMUSCULAR | Status: DC | PRN
  Administered 2017-07-07: 1 mg via INTRAVENOUS

## 2017-07-07 MED ORDER — OXYCODONE HCL 5 MG PO TABS: 5.0000 mg | ORAL_TABLET | Freq: Once | ORAL | Status: DC | PRN

## 2017-07-07 SURGICAL SUPPLY — 74 items
ANCHOR JUGGERKNOT 1.0 1DR 2-0 (Anchor) ×3 IMPLANT
BANDAGE ACE 3X5.8 VEL STRL LF (GAUZE/BANDAGES/DRESSINGS) IMPLANT
BLADE MINI RND TIP GREEN BEAV (BLADE) ×3 IMPLANT
BLADE SURG 15 STRL LF DISP TIS (BLADE) ×2 IMPLANT
BLADE SURG 15 STRL SS (BLADE) ×4
BNDG ELASTIC 2X5.8 VLCR STR LF (GAUZE/BANDAGES/DRESSINGS) IMPLANT
BNDG ESMARK 4X9 LF (GAUZE/BANDAGES/DRESSINGS) ×3 IMPLANT
BNDG GAUZE ELAST 4 BULKY (GAUZE/BANDAGES/DRESSINGS) ×3 IMPLANT
CATH ROBINSON RED A/P 8FR (CATHETERS) IMPLANT
CHLORAPREP W/TINT 26ML (MISCELLANEOUS) ×3 IMPLANT
CORD BIPOLAR FORCEPS 12FT (ELECTRODE) ×3 IMPLANT
COVER BACK TABLE 60X90IN (DRAPES) ×3 IMPLANT
COVER MAYO STAND STRL (DRAPES) ×3 IMPLANT
CUFF TOURNIQUET SINGLE 18IN (TOURNIQUET CUFF) ×3 IMPLANT
DECANTER SPIKE VIAL GLASS SM (MISCELLANEOUS) IMPLANT
DRAPE EXTREMITY T 121X128X90 (DRAPE) ×3 IMPLANT
DRAPE OEC MINIVIEW 54X84 (DRAPES) IMPLANT
DRAPE SURG 17X23 STRL (DRAPES) ×3 IMPLANT
DRSG PAD ABDOMINAL 8X10 ST (GAUZE/BANDAGES/DRESSINGS) IMPLANT
GAUZE SPONGE 4X4 12PLY STRL (GAUZE/BANDAGES/DRESSINGS) ×3 IMPLANT
GAUZE XEROFORM 1X8 LF (GAUZE/BANDAGES/DRESSINGS) ×3 IMPLANT
GLOVE BIO SURGEON STRL SZ 6.5 (GLOVE) ×2 IMPLANT
GLOVE BIO SURGEON STRL SZ7.5 (GLOVE) ×3 IMPLANT
GLOVE BIO SURGEONS STRL SZ 6.5 (GLOVE) ×1
GLOVE BIOGEL PI IND STRL 7.0 (GLOVE) ×2 IMPLANT
GLOVE BIOGEL PI IND STRL 8 (GLOVE) ×1 IMPLANT
GLOVE BIOGEL PI IND STRL 8.5 (GLOVE) ×1 IMPLANT
GLOVE BIOGEL PI INDICATOR 7.0 (GLOVE) ×4
GLOVE BIOGEL PI INDICATOR 8 (GLOVE) ×2
GLOVE BIOGEL PI INDICATOR 8.5 (GLOVE) ×2
GLOVE SURG ORTHO 8.0 STRL STRW (GLOVE) IMPLANT
GOWN STRL REUS W/ TWL LRG LVL3 (GOWN DISPOSABLE) ×1 IMPLANT
GOWN STRL REUS W/TWL LRG LVL3 (GOWN DISPOSABLE) ×2
GOWN STRL REUS W/TWL XL LVL3 (GOWN DISPOSABLE) ×6 IMPLANT
K-WIRE .035X4 (WIRE) IMPLANT
NDL SAFETY ECLIPSE 18X1.5 (NEEDLE) IMPLANT
NEEDLE HYPO 18GX1.5 SHARP (NEEDLE)
NEEDLE HYPO 22GX1.5 SAFETY (NEEDLE) IMPLANT
NEEDLE HYPO 25X1 1.5 SAFETY (NEEDLE) ×3 IMPLANT
NEEDLE KEITH (NEEDLE) IMPLANT
NS IRRIG 1000ML POUR BTL (IV SOLUTION) ×3 IMPLANT
PACK BASIN DAY SURGERY FS (CUSTOM PROCEDURE TRAY) ×3 IMPLANT
PAD CAST 3X4 CTTN HI CHSV (CAST SUPPLIES) ×2 IMPLANT
PAD CAST 4YDX4 CTTN HI CHSV (CAST SUPPLIES) IMPLANT
PADDING CAST ABS 4INX4YD NS (CAST SUPPLIES) ×2
PADDING CAST ABS COTTON 4X4 ST (CAST SUPPLIES) ×1 IMPLANT
PADDING CAST COTTON 3X4 STRL (CAST SUPPLIES) ×4
PADDING CAST COTTON 4X4 STRL (CAST SUPPLIES)
PASSER SUT SWANSON 36MM LOOP (INSTRUMENTS) IMPLANT
SLEEVE SCD COMPRESS KNEE MED (MISCELLANEOUS) ×3 IMPLANT
SLING ARM FOAM STRAP LRG (SOFTGOODS) IMPLANT
SPLINT FAST PLASTER 5X30 (CAST SUPPLIES)
SPLINT PLASTER CAST FAST 5X30 (CAST SUPPLIES) IMPLANT
SPLINT PLASTER CAST XFAST 3X15 (CAST SUPPLIES) ×20 IMPLANT
SPLINT PLASTER XTRA FASTSET 3X (CAST SUPPLIES) ×40
STOCKINETTE 4X48 STRL (DRAPES) ×3 IMPLANT
SUT ETHIBOND 3-0 V-5 (SUTURE) IMPLANT
SUT ETHILON 3 0 PS 1 (SUTURE) IMPLANT
SUT ETHILON 4 0 PS 2 18 (SUTURE) IMPLANT
SUT FIBERWIRE 2-0 18 17.9 3/8 (SUTURE)
SUT FIBERWIRE 3-0 18 TAPR NDL (SUTURE)
SUT MERSILENE 2.0 SH NDLE (SUTURE) IMPLANT
SUT MERSILENE 4 0 P 3 (SUTURE) ×3 IMPLANT
SUT SILK 4 0 PS 2 (SUTURE) IMPLANT
SUT VIC AB 0 SH 27 (SUTURE) IMPLANT
SUT VIC AB 2-0 SH 27 (SUTURE)
SUT VIC AB 2-0 SH 27XBRD (SUTURE) IMPLANT
SUT VICRYL 4-0 PS2 18IN ABS (SUTURE) IMPLANT
SUTURE FIBERWR 2-0 18 17.9 3/8 (SUTURE) IMPLANT
SUTURE FIBERWR 3-0 18 TAPR NDL (SUTURE) IMPLANT
SYR BULB 3OZ (MISCELLANEOUS) ×3 IMPLANT
SYR CONTROL 10ML LL (SYRINGE) IMPLANT
TOWEL OR 17X24 6PK STRL BLUE (TOWEL DISPOSABLE) ×6 IMPLANT
UNDERPAD 30X30 (UNDERPADS AND DIAPERS) ×3 IMPLANT

## 2017-07-07 NOTE — Brief Op Note (Signed)
07/07/2017  9:23 AM  PATIENT:  Donna Garrison  52 y.o. female  PRE-OPERATIVE DIAGNOSIS:  LEFT THUMB ULNAR COLLATERAL LIGAMENT TEAR  POST-OPERATIVE DIAGNOSIS:  LEFT THUMB ULNAR COLLATERAL LIGAMENT TEAR  PROCEDURE:  Procedure(s): LEFT THUMB ULNAR COLLATERAL LIGAMENT REPAIR (Left)  SURGEON:  Surgeon(s) and Role:    * Leanora Cover, MD - Primary    * Daryll Brod, MD - Assisting  PHYSICIAN ASSISTANT:   ASSISTANTS: Daryll Brod, MD   ANESTHESIA:   general  EBL:  Minimal   BLOOD ADMINISTERED:none  DRAINS: none   LOCAL MEDICATIONS USED:  MARCAINE     SPECIMEN:  No Specimen  DISPOSITION OF SPECIMEN:  N/A  COUNTS:  YES  TOURNIQUET:   Total Tourniquet Time Documented: Upper Arm (Left) - 28 minutes Total: Upper Arm (Left) - 28 minutes   DICTATION: .Other Dictation: Dictation Number [id number]  PLAN OF CARE: Discharge to home after PACU  PATIENT DISPOSITION:  PACU - hemodynamically stable.

## 2017-07-07 NOTE — Op Note (Signed)
I assisted Surgeon(s) and Role:    * Leanora Cover, MD - Primary    * Daryll Brod, MD - Assisting on the Procedure(s): LEFT THUMB [place] on 07/07/2017.  I provided assistance on this case as follows: setup, approach, identification of the injury, repair, closure and application of the dressings and splints.  Electronically signed by: Wynonia Sours, MD Date: 07/07/2017 Time: 9:26 AM

## 2017-07-07 NOTE — Op Note (Signed)
[id number] 

## 2017-07-07 NOTE — Anesthesia Preprocedure Evaluation (Addendum)
Anesthesia Evaluation  Patient identified by MRN, date of birth, ID band Patient awake    Reviewed: Allergy & Precautions, NPO status , Patient's Chart, lab work & pertinent test results  Airway Mallampati: I  TM Distance: >3 FB Neck ROM: Full    Dental  (+) Teeth Intact, Dental Advisory Given   Pulmonary former smoker,    breath sounds clear to auscultation       Cardiovascular negative cardio ROS   Rhythm:Regular Rate:Normal     Neuro/Psych PSYCHIATRIC DISORDERS Depression    GI/Hepatic Neg liver ROS, GERD  Medicated,  Endo/Other  negative endocrine ROS  Renal/GU Renal disease     Musculoskeletal negative musculoskeletal ROS (+)   Abdominal Normal abdominal exam  (+)   Peds  Hematology negative hematology ROS (+)   Anesthesia Other Findings   Reproductive/Obstetrics                            Anesthesia Physical Anesthesia Plan  ASA: II  Anesthesia Plan: General   Post-op Pain Management:    Induction: Intravenous  PONV Risk Score and Plan: 4 or greater and Ondansetron, Dexamethasone, Midazolam and Scopolamine patch - Pre-op  Airway Management Planned: LMA  Additional Equipment: None  Intra-op Plan:   Post-operative Plan: Extubation in OR  Informed Consent: I have reviewed the patients History and Physical, chart, labs and discussed the procedure including the risks, benefits and alternatives for the proposed anesthesia with the patient or authorized representative who has indicated his/her understanding and acceptance.   Dental advisory given  Plan Discussed with:   Anesthesia Plan Comments: (Pt declined block.  )      Anesthesia Quick Evaluation

## 2017-07-07 NOTE — Anesthesia Postprocedure Evaluation (Signed)
Anesthesia Post Note  Patient: SYDNI ELIZARRARAZ  Procedure(s) Performed: LEFT THUMB ULNAR COLLATERAL LIGAMENT REPAIR (Left Hand)     Patient location during evaluation: PACU Anesthesia Type: General Level of consciousness: awake and alert Pain management: pain level controlled Vital Signs Assessment: post-procedure vital signs reviewed and stable Respiratory status: spontaneous breathing, nonlabored ventilation, respiratory function stable and patient connected to nasal cannula oxygen Cardiovascular status: blood pressure returned to baseline and stable Postop Assessment: no apparent nausea or vomiting Anesthetic complications: no    Last Vitals:  Vitals:   07/07/17 1020 07/07/17 1045  BP:  (!) 144/86  Pulse: 86 86  Resp: 15 16  Temp:  36.8 C  SpO2: 92% 100%    Last Pain:  Vitals:   07/07/17 1045  TempSrc:   PainSc: [place] Brittnay Pigman

## 2017-07-07 NOTE — H&P (Signed)
  Donna Garrison is an 52 y.o. female.   Chief Complaint: left thumb ucl tear HPI: 52 yo rhd female with left thumb injury while wrestling with son.  MRI shows collateral ligament tear.  She wishes to have collateral ligament repair.  Allergies:  Allergies  Allergen Reactions  . Penicillins Hives  . Sulfa Antibiotics Hives  . Letrozole Other (See Comments)    Muscle aches    Past Medical History:  Diagnosis Date  . Breast cancer, right breast ([place]) 03/22/2011  . Chronic back pain   . Depression   . GERD (gastroesophageal reflux disease)   . Rupture of UCL of left thumb     Past Surgical History:  Procedure Laterality Date  . ABDOMINAL HYSTERECTOMY    . BACK SURGERY     Lumbar Laminectomy L5-S1 2008  . BREAST SURGERY Right    lumpectomy  . TUBAL LIGATION      Family History: Family History  Problem Relation Age of Onset  . Hypertension Mother   . Celiac disease Father   . Celiac disease Sister     Social History:   reports that she quit smoking about 17 years ago. she has never used smokeless tobacco. She reports that she drinks alcohol. She reports that she uses drugs. Drug: Marijuana.  Medications: Medications Prior to Admission  Medication Sig Dispense Refill  . calcium-vitamin D (OSCAL 500/200 D-3) 500-200 MG-UNIT tablet Take 1 tablet by mouth.    . cimetidine (TAGAMET) 200 MG tablet Take 200 mg by mouth 2 (two) times daily.    . Collagen 500 MG CAPS Take by mouth.    . DULoxetine (CYMBALTA) 60 MG capsule Take 60 mg by mouth daily.      . fexofenadine (ALLEGRA) 30 MG tablet Take 30 mg by mouth 2 (two) times daily.    . Glucosamine-Chondroit-Vit C-Mn (GLUCOSAMINE 1500 COMPLEX) CAPS Take by mouth.    Marland Kitchen ibuprofen (ADVIL,MOTRIN) 800 MG tablet Take 1 tablet (800 mg total) by mouth 3 (three) times daily. 21 tablet 0  . Milk Thistle 1000 MG CAPS Take by mouth.      No results found for this or any previous visit (from the past 48 hour(s)).  No results  found.   A comprehensive review of systems was negative.  Blood pressure 123/75, pulse 87, temperature 98 F (36.7 C), temperature source Oral, height 5\' 6"  (1.676 m), weight 101.7 kg (224 lb 2 oz), last menstrual period 08/17/2012, SpO2 98 %.  General appearance: alert, cooperative and appears stated age Head: Normocephalic, without obvious abnormality, atraumatic Neck: supple, symmetrical, trachea midline Cardio: regular rate and rhythm Resp: clear to auscultation bilaterally Extremities: Intact sensation and capillary refill all digits.  +epl/fpl/io.  No wounds.  Pulses: 2+ and symmetric Skin: Skin color, texture, turgor normal. No rashes or lesions Neurologic: Grossly normal Incision/Wound:none  Assessment/Plan Left thumb ulnar collateral ligament tear.  Non operative and operative treatment options were discussed with the patient and patient wishes to proceed with operative treatment. Risks, benefits, and alternatives of surgery were discussed and the patient agrees with the plan of care.   Donna Garrison R 07/07/2017, 8:30 AM

## 2017-07-07 NOTE — Op Note (Signed)
NAMERUCHA, WISSINGER NO.:  [id number]  MEDICAL RECORD NO.:  [id number]  LOCATION:                                 FACILITY:  PHYSICIAN:  Leanora Cover, MD             DATE OF BIRTH:  DATE OF PROCEDURE:  07/07/2017 DATE OF DISCHARGE:                              OPERATIVE REPORT   PREOPERATIVE DIAGNOSIS:  Left thumb ulnar collateral ligament tear.  POSTOPERATIVE DIAGNOSIS:  Left thumb ulnar collateral ligament tear.  PROCEDURE:  Left thumb ulnar collateral ligament repair.  SURGEON:  Leanora Cover, MD.  ASSISTANT:  Daryll Brod, MD.  ANESTHESIA:  General.  IV FLUIDS:  Per anesthesia flow sheet.  ESTIMATED BLOOD LOSS:  Minimal.  COMPLICATIONS:  None.  SPECIMENS:  None.  TOURNIQUET TIME:  27 minutes.  DISPOSITION:  Stable to PACU.  INDICATIONS:  Ms. Bellino is a 52 year old female who states she injured her left thumb while wrestling with her son.  She has had an MRI showing a tear of the ulnar collateral ligament.  She has had continued pain despite splinting.  She wished to have a repair of the ulnar collateral ligament.  Risks, benefits, and alternatives of surgery were discussed including the risk of blood loss, infection, damage to nerves, vessels, tendons, ligaments, and bone, failure of surgery, need for additional surgery, complications with wound healing, continued pain.  She voiced understanding of these risks and elected to proceed.  OPERATIVE COURSE:  After being identified preoperatively by myself, the patient and I agreed upon the procedure and site of procedure.  Surgical site was marked.  The risks, benefits, and alternatives of surgery were reviewed and she wished to proceed.  Surgical consent had been signed. She was given IV Ancef as preoperative antibiotic prophylaxis.  She was transferred to the operating room and placed on the operating room table in supine position with the left upper extremity on an arm board. General  anesthesia was induced by anesthesiologist.  The left upper extremity was prepped and draped in a normal sterile orthopedic fashion. A surgical pause was performed between surgeons, Anesthesia, and operating room staff, and all were in agreement as to the patient, procedure, and site of procedure.  Tourniquet at the proximal aspect of the extremity was inflated to 250 mmHg after exsanguination of the limb with an Esmarch bandage.  An incision was made at the ulnar side of the MP joint of the thumb and carried into subcutaneous tissues by spreading technique.  Bipolar electrocautery was used to obtain hemostasis.  Care was taken to protect all neurovascular structures.  The adductor aponeurosis was identified and incised, leaving a cuff for repair.  The capsule was opened.  The ulnar collateral ligament was identified.  It was partly torn from the proximal phalanx.  The remainder of the ligament was then released.  The footprint was roughened up with the curette.  A 1.0-mm JuggerKnot suture anchor was then used.  A drill hole was placed and the anchor placed.  Good purchase was obtained.  The anchor was then used to reapproximate the ulnar collateral ligament to the footprint of the proximal phalanx.  This  provided good stability of the MP joint to stressing of the ulnar side.  The wound was copiously irrigated with sterile saline.  The adductor aponeurosis was repaired with a running 4-0 Mersilene suture.  The skin was closed with 4-0 nylon in a horizontal mattress fashion.  The wound was injected with 0.25% plain Marcaine to aid in postoperative analgesia.  The wound was then dressed with sterile Xeroform, 4 x 4's and wrapped with a Kerlix bandage.  A thumb spica splint was placed and wrapped with Kerlix and Ace bandage.  Tourniquet was deflated at 27 minutes.  Fingertips were pink with brisk capillary refill after deflation of tourniquet.  The operative drapes were broken down and the  patient was awoken from anesthesia safely.  She was transferred back to stretcher and taken to PACU in stable condition.  I will see her back in the office in 1 week for postoperative followup.  I will give her Norco 5/325 one to two p.o. q.6 hours p.r.n. pain, dispensed #20.     Leanora Cover, MD     KK/MEDQ  D:  07/07/2017  T:  07/07/2017  Job:  [id number]

## 2017-07-07 NOTE — Discharge Instructions (Addendum)
[facility] Instructions Hand Surgery  Wound Care: Keep your hand elevated above the level of your heart.  Do not allow it to dangle by your side.  Keep the dressing dry and do not remove it unless your doctor advises you to do so.  He will usually change it at the time of your post-op visit.  Moving your fingers is advised to stimulate circulation but will depend on the site of your surgery.  If you have a splint applied, your doctor will advise you regarding movement.  Activity: Do not drive or operate machinery today.  Rest today and then you may return to your normal activity and work as indicated by your physician.  Diet:  Drink liquids today or eat a light diet.  You may resume a regular diet tomorrow.    General expectations: Pain for two to three days. Fingers may become slightly swollen.  Call your doctor if any of the following occur: Severe pain not relieved by pain medication. Elevated temperature. Dressing soaked with blood. Inability to move fingers. White or bluish color to fingers.        Post Anesthesia Home Care Instructions  Activity: Get plenty of rest for the remainder of the day. A responsible individual must stay with you for 24 hours following the procedure.  For the next 24 hours, DO NOT: -Drive a car -Paediatric nurse -Drink alcoholic beverages -Take any medication unless instructed by your physician -Make any legal decisions or sign important papers.  Meals: Start with liquid foods such as gelatin or soup. Progress to regular foods as tolerated. Avoid greasy, spicy, heavy foods. If nausea and/or vomiting occur, drink only clear liquids until the nausea and/or vomiting subsides. Call your physician if vomiting continues.  Special Instructions/Symptoms: Your throat may feel dry or sore from the anesthesia or the breathing tube placed in your throat during surgery. If this causes discomfort, gargle with warm salt water. The discomfort should  disappear within 24 hours.  If you had a scopolamine patch placed behind your ear for the management of post- operative nausea and/or vomiting:  1. The medication in the patch is effective for 72 hours, after which it should be removed.  Wrap patch in a tissue and discard in the trash. Wash hands thoroughly with soap and water. 2. You may remove the patch earlier than 72 hours if you experience unpleasant side effects which may include dry mouth, dizziness or visual disturbances. 3. Avoid touching the patch. Wash your hands with soap and water after contact with the patch.    Pt given a total of 1.5mg  of IV Dilaudid in PACU.

## 2017-07-07 NOTE — Transfer of Care (Signed)
Immediate Anesthesia Transfer of Care Note  Patient: Donna Garrison  Procedure(s) Performed: LEFT THUMB ULNAR COLLATERAL LIGAMENT REPAIR (Left Hand)  Patient Location: PACU  Anesthesia Type:General  Level of Consciousness: awake, alert  and oriented  Airway & Oxygen Therapy: Patient Spontanous Breathing and Patient connected to face mask oxygen  Post-op Assessment: Report given to RN and Post -op Vital signs reviewed and stable  Post vital signs: Reviewed and stable  Last Vitals:  Vitals:   07/07/17 0722  BP: 123/75  Pulse: 87  Temp: 36.7 C  SpO2: 98%    Last Pain:  Vitals:   07/07/17 0722  TempSrc: Oral  PainSc: 4          Complications: No apparent anesthesia complications

## 2017-07-07 NOTE — Anesthesia Procedure Notes (Signed)
Procedure Name: LMA Insertion Performed by: Verita Lamb, CRNA Pre-anesthesia Checklist: Patient identified, Emergency Drugs available, Suction available, Patient being monitored and Timeout performed Preoxygenation: Pre-oxygenation with 100% oxygen Induction Type: IV induction LMA: LMA inserted LMA Size: 4.0 Tube type: Oral Number of attempts: 1 Placement Confirmation: positive ETCO2,  CO2 detector and breath sounds checked- equal and bilateral Tube secured with: Tape Dental Injury: Teeth and Oropharynx as per pre-operative assessment

## 2017-07-08 ENCOUNTER — Encounter (HOSPITAL_BASED_OUTPATIENT_CLINIC_OR_DEPARTMENT_OTHER): Payer: Self-pay | Admitting: Orthopedic Surgery

## 2017-11-15 DIAGNOSIS — D2271 Melanocytic nevi of right lower limb, including hip: Secondary | ICD-10-CM | POA: Diagnosis not present

## 2017-11-15 DIAGNOSIS — D485 Neoplasm of uncertain behavior of skin: Secondary | ICD-10-CM | POA: Diagnosis not present

## 2017-11-15 DIAGNOSIS — D1800 Hemangioma unspecified site: Secondary | ICD-10-CM | POA: Diagnosis not present

## 2017-11-15 DIAGNOSIS — L430 Hypertrophic lichen planus: Secondary | ICD-10-CM | POA: Diagnosis not present

## 2017-11-15 DIAGNOSIS — L570 Actinic keratosis: Secondary | ICD-10-CM | POA: Diagnosis not present

## 2017-11-15 DIAGNOSIS — D2261 Melanocytic nevi of right upper limb, including shoulder: Secondary | ICD-10-CM | POA: Diagnosis not present

## 2017-11-15 DIAGNOSIS — D225 Melanocytic nevi of trunk: Secondary | ICD-10-CM | POA: Diagnosis not present

## 2017-12-08 DIAGNOSIS — L720 Epidermal cyst: Secondary | ICD-10-CM | POA: Diagnosis not present

## 2017-12-08 DIAGNOSIS — D485 Neoplasm of uncertain behavior of skin: Secondary | ICD-10-CM | POA: Diagnosis not present

## 2018-07-27 DIAGNOSIS — L03211 Cellulitis of face: Secondary | ICD-10-CM | POA: Diagnosis not present

## 2018-07-27 DIAGNOSIS — C50911 Malignant neoplasm of unspecified site of right female breast: Secondary | ICD-10-CM | POA: Diagnosis not present

## 2018-07-27 DIAGNOSIS — Z170 Estrogen receptor positive status [ER+]: Secondary | ICD-10-CM | POA: Diagnosis not present

## 2018-07-27 DIAGNOSIS — Z853 Personal history of malignant neoplasm of breast: Secondary | ICD-10-CM | POA: Diagnosis not present

## 2018-07-27 DIAGNOSIS — R220 Localized swelling, mass and lump, head: Secondary | ICD-10-CM | POA: Diagnosis not present

## 2018-07-27 DIAGNOSIS — K769 Liver disease, unspecified: Secondary | ICD-10-CM | POA: Diagnosis not present

## 2018-07-27 DIAGNOSIS — G501 Atypical facial pain: Secondary | ICD-10-CM | POA: Diagnosis not present

## 2018-07-27 DIAGNOSIS — R601 Generalized edema: Secondary | ICD-10-CM | POA: Diagnosis not present

## 2018-07-28 DIAGNOSIS — L03211 Cellulitis of face: Secondary | ICD-10-CM | POA: Diagnosis not present

## 2018-07-29 ENCOUNTER — Encounter (HOSPITAL_COMMUNITY): Payer: Self-pay | Admitting: Emergency Medicine

## 2018-07-29 ENCOUNTER — Emergency Department (HOSPITAL_COMMUNITY): Payer: BLUE CROSS/BLUE SHIELD

## 2018-07-29 ENCOUNTER — Inpatient Hospital Stay (HOSPITAL_COMMUNITY)
Admission: EM | Admit: 2018-07-29 | Discharge: 2018-07-31 | DRG: 603 | Disposition: A | Discharge status: Home / Self Care | Payer: BLUE CROSS/BLUE SHIELD | Attending: Internal Medicine | Admitting: Internal Medicine

## 2018-07-29 ENCOUNTER — Other Ambulatory Visit: Payer: Self-pay

## 2018-07-29 DIAGNOSIS — F329 Major depressive disorder, single episode, unspecified: Secondary | ICD-10-CM | POA: Diagnosis not present

## 2018-07-29 DIAGNOSIS — Z8249 Family history of ischemic heart disease and other diseases of the circulatory system: Secondary | ICD-10-CM

## 2018-07-29 DIAGNOSIS — Z87891 Personal history of nicotine dependence: Secondary | ICD-10-CM

## 2018-07-29 DIAGNOSIS — M549 Dorsalgia, unspecified: Secondary | ICD-10-CM | POA: Diagnosis present

## 2018-07-29 DIAGNOSIS — Z853 Personal history of malignant neoplasm of breast: Secondary | ICD-10-CM | POA: Diagnosis not present

## 2018-07-29 DIAGNOSIS — F32A Depression, unspecified: Secondary | ICD-10-CM

## 2018-07-29 DIAGNOSIS — J310 Chronic rhinitis: Secondary | ICD-10-CM | POA: Diagnosis present

## 2018-07-29 DIAGNOSIS — E669 Obesity, unspecified: Secondary | ICD-10-CM | POA: Diagnosis present

## 2018-07-29 DIAGNOSIS — G8929 Other chronic pain: Secondary | ICD-10-CM | POA: Diagnosis present

## 2018-07-29 DIAGNOSIS — L03213 Periorbital cellulitis: Secondary | ICD-10-CM | POA: Diagnosis not present

## 2018-07-29 DIAGNOSIS — Z882 Allergy status to sulfonamides status: Secondary | ICD-10-CM

## 2018-07-29 DIAGNOSIS — K219 Gastro-esophageal reflux disease without esophagitis: Secondary | ICD-10-CM

## 2018-07-29 DIAGNOSIS — Z8379 Family history of other diseases of the digestive system: Secondary | ICD-10-CM

## 2018-07-29 DIAGNOSIS — Z880 Allergy status to penicillin: Secondary | ICD-10-CM | POA: Diagnosis not present

## 2018-07-29 DIAGNOSIS — H05221 Edema of right orbit: Secondary | ICD-10-CM | POA: Diagnosis not present

## 2018-07-29 DIAGNOSIS — Z6833 Body mass index (BMI) 33.0-33.9, adult: Secondary | ICD-10-CM

## 2018-07-29 DIAGNOSIS — E66811 Obesity, class 1: Secondary | ICD-10-CM

## 2018-07-29 LAB — CBC WITH DIFFERENTIAL/PLATELET
Abs Immature Granulocytes: 0.02 10*3/uL (ref 0.00–0.07)
BASOS ABS: 0 10*3/uL (ref 0.0–0.1)
BASOS PCT: 0 %
EOS ABS: 0.2 10*3/uL (ref 0.0–0.5)
Eosinophils Relative: 2 %
HCT: 41.9 % (ref 36.0–46.0)
Hemoglobin: 13.5 g/dL (ref 12.0–15.0)
IMMATURE GRANULOCYTES: 0 %
Lymphocytes Relative: 25 %
Lymphs Abs: 2.4 10*3/uL (ref 0.7–4.0)
MCH: 29.2 pg (ref 26.0–34.0)
MCHC: 32.2 g/dL (ref 30.0–36.0)
MCV: 90.5 fL (ref 80.0–100.0)
Monocytes Absolute: 0.7 10*3/uL (ref 0.1–1.0)
Monocytes Relative: 7 %
NEUTROS PCT: 66 %
NRBC: 0 % (ref 0.0–0.2)
Neutro Abs: 6.1 10*3/uL (ref 1.7–7.7)
PLATELETS: 219 10*3/uL (ref 150–400)
RBC: 4.63 MIL/uL (ref 3.87–5.11)
RDW: 14.2 % (ref 11.5–15.5)
WBC: 9.4 10*3/uL (ref 4.0–10.5)

## 2018-07-29 LAB — BASIC METABOLIC PANEL
Anion gap: 8 (ref 5–15)
BUN: 13 mg/dL (ref 6–20)
CALCIUM: 9.1 mg/dL (ref 8.9–10.3)
CO2: 26 mmol/L (ref 22–32)
CREATININE: 0.75 mg/dL (ref 0.44–1.00)
Chloride: 106 mmol/L (ref 98–111)
Glucose, Bld: 87 mg/dL (ref 70–99)
Potassium: 3.9 mmol/L (ref 3.5–5.1)
SODIUM: 140 mmol/L (ref 135–145)

## 2018-07-29 LAB — MRSA PCR SCREENING: MRSA by PCR: NEGATIVE

## 2018-07-29 MED ORDER — VANCOMYCIN HCL IN DEXTROSE 1-5 GM/200ML-% IV SOLN
1000.0000 mg | Freq: Three times a day (TID) | INTRAVENOUS | Status: DC
  Administered 2018-07-30 – 2018-07-31 (×5): 1000 mg via INTRAVENOUS
  Filled 2018-07-29 (×5): qty 200

## 2018-07-29 MED ORDER — ONDANSETRON HCL 4 MG PO TABS: 4.0000 mg | ORAL_TABLET | Freq: Four times a day (QID) | ORAL | Status: DC | PRN

## 2018-07-29 MED ORDER — DULOXETINE HCL 60 MG PO CPEP
60.0000 mg | ORAL_CAPSULE | Freq: Every day | ORAL | Status: DC
  Administered 2018-07-29 – 2018-07-31 (×3): 60 mg via ORAL
  Filled 2018-07-29 (×3): qty 1

## 2018-07-29 MED ORDER — SODIUM CHLORIDE 0.9 % IV SOLN
1.0000 g | Freq: Once | INTRAVENOUS | Status: AC
  Administered 2018-07-29: 1 g via INTRAVENOUS
  Filled 2018-07-29: qty 10

## 2018-07-29 MED ORDER — LORATADINE 10 MG PO TABS
10.0000 mg | ORAL_TABLET | Freq: Every day | ORAL | Status: DC
Start: 2018-07-29 — End: 2018-07-31
  Administered 2018-07-29 – 2018-07-31 (×3): 10 mg via ORAL
  Filled 2018-07-29 (×3): qty 1

## 2018-07-29 MED ORDER — VANCOMYCIN HCL IN DEXTROSE 1-5 GM/200ML-% IV SOLN
1000.0000 mg | INTRAVENOUS | Status: AC
Start: 2018-07-29 — End: 2018-07-29
  Administered 2018-07-29 (×2): 1000 mg via INTRAVENOUS
  Filled 2018-07-29 (×2): qty 200

## 2018-07-29 MED ORDER — ONDANSETRON HCL 4 MG/2ML IJ SOLN
4.0000 mg | Freq: Four times a day (QID) | INTRAMUSCULAR | Status: DC | PRN
  Administered 2018-07-30: 4 mg via INTRAVENOUS
  Filled 2018-07-29: qty 2

## 2018-07-29 MED ORDER — SODIUM CHLORIDE 0.9 % IV SOLN
2.0000 g | INTRAVENOUS | Status: DC
  Administered 2018-07-30: 2 g via INTRAVENOUS
  Filled 2018-07-29: qty 20

## 2018-07-29 MED ORDER — ENOXAPARIN SODIUM 40 MG/0.4ML ~~LOC~~ SOLN
40.0000 mg | SUBCUTANEOUS | Status: DC
  Administered 2018-07-29 – 2018-07-30 (×2): 40 mg via SUBCUTANEOUS
  Filled 2018-07-29 (×2): qty 0.4

## 2018-07-29 MED ORDER — IOHEXOL 300 MG/ML  SOLN
75.0000 mL | Freq: Once | INTRAMUSCULAR | Status: AC | PRN
  Administered 2018-07-29: 75 mL via INTRAVENOUS

## 2018-07-29 MED ORDER — HYDROCODONE-ACETAMINOPHEN 5-325 MG PO TABS
1.0000 | ORAL_TABLET | ORAL | Status: DC | PRN
  Administered 2018-07-29 – 2018-07-30 (×2): 1 via ORAL
  Filled 2018-07-29 (×2): qty 1

## 2018-07-29 MED ORDER — FAMOTIDINE 20 MG PO TABS
20.0000 mg | ORAL_TABLET | Freq: Every day | ORAL | Status: DC
  Administered 2018-07-29 – 2018-07-31 (×3): 20 mg via ORAL
  Filled 2018-07-29 (×3): qty 1

## 2018-07-29 MED ORDER — SODIUM CHLORIDE 0.9 % IV SOLN: 1.0000 g | INTRAVENOUS | Status: DC

## 2018-07-29 MED ORDER — IBUPROFEN 400 MG PO TABS
400.0000 mg | ORAL_TABLET | Freq: Once | ORAL | Status: AC
  Administered 2018-07-29: 400 mg via ORAL
  Filled 2018-07-29: qty 1

## 2018-07-29 NOTE — Progress Notes (Signed)
Pharmacy Antibiotic Note  Donna Garrison is a 53 y.o. female admitted on 07/29/2018 with periorbital cellulitis.  Pharmacy has been consulted for Vancomycin dosing.  Plan: Vancomycin 2000mg  loading dose, then 1000mg   IV every 8 hours.  Goal trough 15-20 mcg/mL.  Also, ceftriaxone 2gm IV q24h F/U cxs and clinical progress Monitor V/S, labs, and levels as indicated  Height: 5\' 6"  (167.6 cm) Weight: 210 lb (95.3 kg) IBW/kg (Calculated) : 59.3  Temp (24hrs), Avg:97.9 F (36.6 C), Min:97.7 F (36.5 C), Max:98 F (36.7 C)  Recent Labs  Lab 07/29/18 1230  WBC 9.4  CREATININE 0.75    Estimated Creatinine Clearance: 95.7 mL/min (by C-G formula based on SCr of 0.75 mg/dL).    Allergies  Allergen Reactions  . Penicillins Hives  . Sulfa Antibiotics Hives  . Letrozole Other (See Comments)    Muscle aches    Antimicrobials this admission: Vancomycin 3/14 >>  Ceftriaxone 3/14 >>   Dose adjustments this admission: N/A  Microbiology results:  BCx:   MRSA PCR:   Thank you for allowing pharmacy to be a part of this patient's care.  Isac Sarna, BS [place], [place] Clinical Pharmacist Pager [phone] 07/29/2018 4:48 PM

## 2018-07-29 NOTE — ED Triage Notes (Signed)
Pt reports she was dx with periorbital cellulitis Thursday and was given 2 abx as well as a shot of abx yesterday and was told to come to ED if not better.  Pt states is worse.

## 2018-07-29 NOTE — ED Notes (Signed)
Visual acuity done.  Both eyes 20/20.  Left eye 20/30.  Right eye 20/15

## 2018-07-29 NOTE — H&P (Signed)
History and Physical  Donna Garrison VCB:[id number] DOB: 28-Mar-1966 DOA: 07/29/2018  Referring physician: Dr Sedonia Small, ED physician PCP: Donna Melter, MD  Outpatient Specialists:   Patient Coming From: home  Chief Complaint: right eye swelling  HPI: Donna Garrison is a 53 y.o. female with a history of GERD, depression, breast cancer.  Patient seen for right eye swelling and redness.  Started earlier this week with a pimple on the right side of her nose which progressed to pain and induration of the right cheek.  She saw her PCP on Thursday was placed on doxycycline and cefdinir. Her symptoms did not improve. No fevers, chills, nausea, vomiting.  Emergency Department Course: CT showed no evidence of orbital involvement or abscess.  Review of Systems:   Pt denies any fevers, chills, nausea, vomiting, diarrhea, constipation, abdominal pain, shortness of breath, dyspnea on exertion, orthopnea, cough, wheezing, palpitations, headache, vision changes, lightheadedness, dizziness, melena, rectal bleeding.  Review of systems are otherwise negative  Past Medical History:  Diagnosis Date   Breast cancer, right breast ([place]) 03/22/2011   Chronic back pain    Depression    GERD (gastroesophageal reflux disease)    Rupture of UCL of left thumb    Past Surgical History:  Procedure Laterality Date   ABDOMINAL HYSTERECTOMY     BACK SURGERY     Lumbar Laminectomy L5-S1 2008   BREAST SURGERY Right    lumpectomy   TUBAL LIGATION     ULNAR COLLATERAL LIGAMENT REPAIR Left 07/07/2017   Procedure: LEFT THUMB ULNAR COLLATERAL LIGAMENT REPAIR;  Surgeon: Donna Cover, MD;  Location: [place];  Service: Orthopedics;  Laterality: Left;   Social History:  reports that she quit smoking about 18 years ago. She has never used smokeless tobacco. She reports current alcohol use. She reports current drug use. Drug: Marijuana. Patient lives at home  Allergies  Allergen Reactions    Penicillins Hives   Sulfa Antibiotics Hives   Letrozole Other (See Comments)    Muscle aches    Family History  Problem Relation Age of Onset   Hypertension Mother    Celiac disease Father    Celiac disease Sister       Prior to Admission medications   Medication Sig Start Date End Date Taking? Authorizing Provider  calcium-vitamin D (OSCAL 500/200 D-3) 500-200 MG-UNIT tablet Take 1 tablet by mouth.   Yes [provider]  cefdinir (OMNICEF) 300 MG capsule Take 1 capsule by mouth 2 (two) times daily. For 7 days 07/27/18  Yes [provider]  Collagen 500 MG CAPS Take by mouth.   Yes [provider]  doxycycline (VIBRAMYCIN) 100 MG capsule Take 1 capsule by mouth daily. 07/27/18  Yes [provider]  DULoxetine (CYMBALTA) 60 MG capsule Take 60 mg by mouth daily.     Yes [provider]  fexofenadine (ALLEGRA) 30 MG tablet Take 30 mg by mouth 2 (two) times daily.   Yes [provider]  Glucosamine-Chondroit-Vit C-Mn (GLUCOSAMINE 1500 COMPLEX) CAPS Take by mouth.   Yes [provider]  ibuprofen (ADVIL,MOTRIN) 800 MG tablet Take 1 tablet (800 mg total) by mouth 3 (three) times daily. 09/19/16  Yes Noemi Chapel, MD  Milk Thistle 1000 MG CAPS Take by mouth.   Yes [provider]  Multiple Vitamin (MULTIVITAMIN) tablet Take by mouth.   Yes [provider]  cimetidine (TAGAMET) 200 MG tablet Take 200 mg by mouth 2 (two) times daily.  [provider]  HYDROcodone-acetaminophen [facility]) 5-325 MG tablet 1-2 tabs po q6 hours prn pain Patient not taking: Reported on 07/29/2018 07/07/17   Donna Cover, MD    Physical Exam: BP 103/63    Pulse 90    Temp 97.7 F (36.5 C) (Oral)    Resp 18    Ht 5\' 6"  (1.676 m)    Wt 95.3 kg    LMP 08/17/2012    SpO2 100%    BMI 33.89 kg/m    General: 73 Caucasian female. Awake and alert and oriented x3. No acute cardiopulmonary distress.   HEENT: Normocephalic  atraumatic.  Right and left ears normal in appearance.  Pupils equal, round, reactive to light. Extraocular muscles are intact, although has discomfort with right eye looking to the right and up. Sclerae anicteric and noninjected.  Moist mucosal membranes. No mucosal lesions.   Neck: Neck supple without lymphadenopathy. No carotid bruits. No masses palpated.   Cardiovascular: Regular rate with normal S1-S2 sounds. No murmurs, rubs, gallops auscultated. No JVD.   Respiratory: Good respiratory effort with no wheezes, rales, rhonchi. Lungs clear to auscultation bilaterally.  No accessory muscle use.  Abdomen: Soft, nontender, nondistended. Active bowel sounds. No masses or hepatosplenomegaly   Skin: Has erythema and tenderness and swelling around the soft tissues of the right orbit including down on the cheek he had about a centimeter and a half inferior to the nasolabial fold.  There is a slight indurated area just to the right of the right side of the nose. no rashes, lesions, or ulcerations.  Dry, warm to touch. 2+ dorsalis pedis and radial pulses.  Musculoskeletal: No calf or leg pain. All major joints not erythematous nontender.  No upper or lower joint deformation.  Good ROM.  No contractures   Psychiatric: Intact judgment and insight. Pleasant and cooperative.  Neurologic: No focal neurological deficits. Strength is 5/5 and symmetric in upper and lower extremities.  Cranial nerves II through XII are grossly intact.           Labs on Admission: I have personally reviewed following labs and imaging studies  CBC: Recent Labs  Lab 07/29/18 1230  WBC 9.4  NEUTROABS 6.1  HGB 13.5  HCT 41.9  MCV 90.5  PLT 086   Basic Metabolic Panel: Recent Labs  Lab 07/29/18 1230  NA 140  K 3.9  CL 106  CO2 26  GLUCOSE 87  BUN 13  CREATININE 0.75  CALCIUM 9.1   GFR: Estimated Creatinine Clearance: 95.7 mL/min (by C-G formula based on SCr of 0.75 mg/dL). Liver Function Tests: No results  for input(s): AST, ALT, ALKPHOS, BILITOT, PROT, ALBUMIN in the last 168 hours. No results for input(s): LIPASE, AMYLASE in the last 168 hours. No results for input(s): AMMONIA in the last 168 hours. Coagulation Profile: No results for input(s): INR, PROTIME in the last 168 hours. Cardiac Enzymes: No results for input(s): CKTOTAL, CKMB, CKMBINDEX, TROPONINI in the last 168 hours. BNP (last 3 results) No results for input(s): PROBNP in the last 8760 hours. HbA1C: No results for input(s): HGBA1C in the last 72 hours. CBG: No results for input(s): GLUCAP in the last 168 hours. Lipid Profile: No results for input(s): CHOL, HDL, LDLCALC, TRIG, CHOLHDL, LDLDIRECT in the last 72 hours. Thyroid Function Tests: No results for input(s): TSH, T4TOTAL, FREET4, T3FREE, THYROIDAB in the last 72 hours. Anemia Panel: No results for input(s): VITAMINB12, FOLATE, FERRITIN, TIBC, IRON, RETICCTPCT in the last 72 hours. Urine analysis: No results  found for: COLORURINE, APPEARANCEUR, LABSPEC, PHURINE, GLUCOSEU, HGBUR, BILIRUBINUR, KETONESUR, PROTEINUR, UROBILINOGEN, NITRITE, LEUKOCYTESUR Sepsis Labs: @LABRCNTIP (procalcitonin:4,lacticidven:4) )No results found for this or any previous visit (from the past 240 hour(s)).   Radiological Exams on Admission: Ct Maxillofacial W Contrast  Result Date: 07/29/2018 CLINICAL DATA:  53 year old female treated for periorbital cellulitis with antibiotics for 2 days. Progressive symptoms. EXAM: CT MAXILLOFACIAL WITH CONTRAST TECHNIQUE: Multidetector CT imaging of the maxillofacial structures was performed with intravenous contrast. Multiplanar CT image reconstructions were also generated. CONTRAST:  5mL OMNIPAQUE IOHEXOL 300 MG/ML  SOLN COMPARISON:  None. FINDINGS: Osseous: Mandible and facial bones appear intact. No dental abnormality identified. Intact central skull base and visible cervical spine. No acute osseous abnormality identified. Orbits: Intact orbital walls.  Asymmetric periorbital soft tissue thickening and swelling on the right (series 2, image 29). The right globe remains intact. The bilateral postseptal soft tissues appears symmetric and normal. Associated soft tissue thickening and swelling continues along the premalar space and bridge of the nose greater on the right. No soft tissue gas. No fluid collection identified. Mild if any preseptal inflammation on the left. Sinuses: Clear aside from mild right maxillary alveolar recess mucosal thickening. Negative nasal cavity. Soft tissues: Negative visible thyroid, larynx, pharynx, parapharyngeal spaces, retropharyngeal space, sublingual space, submandibular spaces, parotid spaces, and masticator spaces. Mild reactive appearing right level 1 B lymph node enlargement. No cystic or heterogeneous nodes. Level 2 nodes appear symmetric and normal. Major vascular structures in the neck and at the skull base are patent. Limited intracranial: The cavernous sinus appears symmetric and patent. Negative visualized brain parenchyma. IMPRESSION: Right side preseptal and facial cellulitis. No postseptal involvement, soft tissue gas, abscess, or complicating features. Electronically Signed   By: Genevie Ann M.D.   On: 07/29/2018 14:06     Assessment/Plan: Active Problems:   Periorbital cellulitis of right eye    This patient was discussed with the ED physician, including pertinent vitals, physical exam findings, labs, and imaging.  We also discussed care given by the ED provider.  1. Cellulitis a. Failed outpatient therapy. b. Vancomycin and ceftriaxone c. Will obtain MRSA swab d. CBC in the morning  DVT prophylaxis: Lovenox Consultants: None Code Status: Full code Family Communication: Husband Disposition Plan: Patient will likely need 2 days inpatient for improvement of the cellulitis.  Patient to return home following admission   Truett Mainland, DO

## 2018-07-29 NOTE — ED Provider Notes (Signed)
[facility] Provider Note   CSN: [id number] Arrival date & time: 07/29/18  1040    History   Chief Complaint Chief Complaint  Patient presents with   Eye Problem    HPI Donna Garrison is a 53 y.o. female.     Patient is a 53 year old female who presents to the emergency department with a complaint of facial swelling and facial pain.  The patient states she has been having problems with cyst at multiple areas of her body.  She developed one below her eye on the right corner of her nose.  She was seen sometime back with ear nose and throat.  They did not want to do surgery on it.  I told her to express it whenever it was needed.  To return if the situation became worse.  The patient states that she did express this cyst area and it has been getting a little more irritated over the last 3 to 4 months.  She has been using warm compresses and they were not effective.  On Monday, March 9, it got worse.  On Tuesday it seemed to continue to get worse.  The patient was seen by the physician on Thursday, March 12.  The patient was placed on doxycycline and cefdinir.  On the following day she received an injection of antibiotic.  She was told at that point if it did not seem to be improving to come to the emergency department.  The patient states that she can hardly open her eye on the right.  She has pain to even light touch.  She has not had any high fever.  She says the maximum temperature has been 99.5.  She presents now for assistance with this issue.  She has not been vomiting.  She has not had sweats.  She is unsure if she may have had some chills related to this.  No other facial involvement.  The history is provided by the patient.  Eye Problem  Associated symptoms: redness   Associated symptoms: no discharge and no photophobia     Past Medical History:  Diagnosis Date   Breast cancer, right breast ([place]) 03/22/2011   Chronic back pain    Depression    GERD  (gastroesophageal reflux disease)    Rupture of UCL of left thumb     Patient Active Problem List   Diagnosis Date Noted   Breast cancer, right breast ([place]) 03/22/2011   Renal calculi 03/22/2011    Past Surgical History:  Procedure Laterality Date   ABDOMINAL HYSTERECTOMY     BACK SURGERY     Lumbar Laminectomy L5-S1 2008   BREAST SURGERY Right    lumpectomy   TUBAL LIGATION     ULNAR COLLATERAL LIGAMENT REPAIR Left 07/07/2017   Procedure: LEFT THUMB ULNAR COLLATERAL LIGAMENT REPAIR;  Surgeon: Leanora Cover, MD;  Location: [place];  Service: Orthopedics;  Laterality: Left;     OB History   No obstetric history on file.      Home Medications    Prior to Admission medications   Medication Sig Start Date End Date Taking? Authorizing Provider  calcium-vitamin D (OSCAL 500/200 D-3) 500-200 MG-UNIT tablet Take 1 tablet by mouth.   Yes [provider]  cefdinir (OMNICEF) 300 MG capsule Take 1 capsule by mouth 2 (two) times daily. For 7 days 07/27/18  Yes [provider]  Collagen 500 MG CAPS Take by mouth.   Yes [provider]  doxycycline (  VIBRAMYCIN) 100 MG capsule Take 1 capsule by mouth daily. 07/27/18  Yes [provider]  DULoxetine (CYMBALTA) 60 MG capsule Take 60 mg by mouth daily.     Yes [provider]  fexofenadine (ALLEGRA) 30 MG tablet Take 30 mg by mouth 2 (two) times daily.   Yes [provider]  Glucosamine-Chondroit-Vit C-Mn (GLUCOSAMINE 1500 COMPLEX) CAPS Take by mouth.   Yes [provider]  ibuprofen (ADVIL,MOTRIN) 800 MG tablet Take 1 tablet (800 mg total) by mouth 3 (three) times daily. 09/19/16  Yes Noemi Chapel, MD  Milk Thistle 1000 MG CAPS Take by mouth.   Yes [provider]  Multiple Vitamin (MULTIVITAMIN) tablet Take by mouth.   Yes [provider]  cimetidine (TAGAMET) 200 MG tablet Take 200 mg by mouth 2 (two) times daily.    [provider]  HYDROcodone-acetaminophen ([facility]CO) 5-325 MG tablet 1-2 tabs po q6 hours prn pain Patient not taking: Reported on 07/29/2018 07/07/17   Leanora Cover, MD    Family History Family History  Problem Relation Age of Onset   Hypertension Mother    Celiac disease Father    Celiac disease Sister     Social History Social History   Tobacco Use   Smoking status: Former Smoker    Last attempt to quit: 05/17/2000    Years since quitting: 18.2   Smokeless tobacco: Never Used  Substance Use Topics   Alcohol use: Yes    Comment: occasional   Drug use: Yes    Types: Marijuana    Comment: occ     Allergies   Penicillins; Sulfa antibiotics; and Letrozole   Review of Systems Review of Systems  Constitutional: Negative for activity change.       All ROS Neg except as noted in HPI  HENT: Positive for facial swelling. Negative for nosebleeds.   Eyes: Positive for redness. Negative for photophobia and discharge.  Respiratory: Negative for cough, shortness of breath and wheezing.   Cardiovascular: Negative for chest pain and palpitations.  Gastrointestinal: Negative for abdominal pain and blood in stool.  Genitourinary: Negative for dysuria, frequency and hematuria.  Musculoskeletal: Negative for arthralgias, back pain and neck pain.  Skin: Negative.   Neurological: Negative for dizziness, seizures and speech difficulty.  Psychiatric/Behavioral: Negative for confusion and hallucinations.     Physical Exam Updated Vital Signs BP (!) 139/97 (BP Location: Left Arm)    Pulse 78    Temp 97.7 F (36.5 C) (Oral)    Resp 18    Ht 5\' 6"  (1.676 m)    Wt 95.3 kg    LMP 08/17/2012    SpO2 100%    BMI 33.89 kg/m   Physical Exam Vitals signs and nursing note reviewed.  Constitutional:      Appearance: She is well-developed. She is not toxic-appearing.  HENT:     Head: Normocephalic.     Comments: There is a red raised area on the right lateral nose it extends up beyond the eye near the  eyebrow.  There is some swelling of the right cheek.  Both areas are tender to touch and warm.  The upper and lower eyelids are swollen.  There is also some mild soreness at the upper left side of the nose.  No significant redness on the left side.    Right Ear: Tympanic membrane and external ear normal.     Left Ear: Tympanic membrane and external ear normal.  Eyes:     General:  Lids are normal.     Pupils: Pupils are equal, round, and reactive to light.     Comments: The extraocular movements are intact.  There is pain however with attempted range of motion of the eyes.  The visual acuity was obtained and found to be within normal limits.  The right bulbar conjunctiva is mildly injected.  Neck:     Musculoskeletal: Normal range of motion and neck supple.     Vascular: No carotid bruit.  Cardiovascular:     Rate and Rhythm: Normal rate and regular rhythm.     Pulses: Normal pulses.     Heart sounds: Normal heart sounds.  Pulmonary:     Effort: No respiratory distress.     Breath sounds: Normal breath sounds.  Abdominal:     General: Bowel sounds are normal.     Palpations: Abdomen is soft.     Tenderness: There is no abdominal tenderness. There is no guarding.  Musculoskeletal: Normal range of motion.  Lymphadenopathy:     Head:     Right side of head: No submandibular adenopathy.     Left side of head: No submandibular adenopathy.     Cervical: No cervical adenopathy.  Skin:    General: Skin is warm and dry.  Neurological:     Mental Status: She is alert and oriented to person, place, and time.     Cranial Nerves: No cranial nerve deficit.     Sensory: No sensory deficit.  Psychiatric:        Speech: Speech normal.      ED Treatments / Results  Labs (all labs ordered are listed, but only abnormal results are displayed) Labs Reviewed  CBC WITH DIFFERENTIAL/PLATELET  BASIC METABOLIC PANEL    EKG None  Radiology Ct Maxillofacial W Contrast  Result Date:  07/29/2018 CLINICAL DATA:  53 year old female treated for periorbital cellulitis with antibiotics for 2 days. Progressive symptoms. EXAM: CT MAXILLOFACIAL WITH CONTRAST TECHNIQUE: Multidetector CT imaging of the maxillofacial structures was performed with intravenous contrast. Multiplanar CT image reconstructions were also generated. CONTRAST:  53mL OMNIPAQUE IOHEXOL 300 MG/ML  SOLN COMPARISON:  None. FINDINGS: Osseous: Mandible and facial bones appear intact. No dental abnormality identified. Intact central skull base and visible cervical spine. No acute osseous abnormality identified. Orbits: Intact orbital walls. Asymmetric periorbital soft tissue thickening and swelling on the right (series 2, image 29). The right globe remains intact. The bilateral postseptal soft tissues appears symmetric and normal. Associated soft tissue thickening and swelling continues along the premalar space and bridge of the nose greater on the right. No soft tissue gas. No fluid collection identified. Mild if any preseptal inflammation on the left. Sinuses: Clear aside from mild right maxillary alveolar recess mucosal thickening. Negative nasal cavity. Soft tissues: Negative visible thyroid, larynx, pharynx, parapharyngeal spaces, retropharyngeal space, sublingual space, submandibular spaces, parotid spaces, and masticator spaces. Mild reactive appearing right level 1 B lymph node enlargement. No cystic or heterogeneous nodes. Level 2 nodes appear symmetric and normal. Major vascular structures in the neck and at the skull base are patent. Limited intracranial: The cavernous sinus appears symmetric and patent. Negative visualized brain parenchyma. IMPRESSION: Right side preseptal and facial cellulitis. No postseptal involvement, soft tissue gas, abscess, or complicating features. Electronically Signed   By: Genevie Ann M.D.   On: 07/29/2018 14:06    Procedures Procedures (including critical care time)  Medications Ordered in  ED Medications  cefTRIAXone (ROCEPHIN) 1 g in sodium chloride 0.9 % 100  mL IVPB (0 g Intravenous Stopped 07/29/18 1345)  iohexol (OMNIPAQUE) 300 MG/ML solution 75 mL (75 mLs Intravenous Contrast Given 07/29/18 1341)  ibuprofen (ADVIL,MOTRIN) tablet 400 mg (400 mg Oral Given 07/29/18 1406)     Initial Impression / Assessment and Plan / ED Course  I have reviewed the triage vital signs and the nursing notes.  Pertinent labs & imaging results that were available during my care of the patient were reviewed by me and considered in my medical decision making (see chart for details).          Final Clinical Impressions(s) / ED Diagnoses MDM  Heart rate and blood pressure elevated on admission to the emergency department.  The remainder the vital signs within normal limits.  Pulse oximetry is 100% on room air.  Within normal limits by my interpretation.  I reviewed the medications that the patient has been using over the last couple of days.   The examination favors cellulitis.  Will obtain a CT scan to evaluate for orbital cellulitis, and/or abscess. Rocephin 1 gram given.  Complete blood count is well within normal limits.  Basic metabolic panel is within normal limits.  CT maxillofacial scan with contrast shows right-sided preseptal and facial cellulitis.  There is no post septal involvement.  There is no soft tissue gas and no abscess appreciated.  Patient seen with me by Dr. Sedonia Small.  The lab results as well as the CT results were discussed with the patient in terms of which she understands.  I am concerned that the patient may have failed outpatient therapy at this point and should probably be admitted to the hospital for IV antibiotics.  The patient is in agreement for admission.  Will discuss with the hospitalist. Pt to be admitted.   Final diagnoses:  Periorbital cellulitis of right eye    ED Discharge Orders    None       Lily Kocher, PA-C 07/29/18 1639    Maudie Flakes, MD 07/30/18 [phone]

## 2018-07-30 MED ORDER — NAPHAZOLINE-GLYCERIN 0.012-0.2 % OP SOLN
1.0000 [drp] | Freq: Four times a day (QID) | OPHTHALMIC | Status: DC | PRN
  Administered 2018-07-30: 2 [drp] via OPHTHALMIC
  Filled 2018-07-30: qty 15

## 2018-07-30 MED ORDER — IBUPROFEN 600 MG PO TABS
600.0000 mg | ORAL_TABLET | Freq: Four times a day (QID) | ORAL | Status: DC | PRN
  Administered 2018-07-30 – 2018-07-31 (×4): 600 mg via ORAL
  Filled 2018-07-30 (×4): qty 1

## 2018-07-30 MED ORDER — IBUPROFEN 400 MG PO TABS: 200.0000 mg | ORAL_TABLET | Freq: Once | ORAL | Status: DC

## 2018-07-30 NOTE — Progress Notes (Signed)
PROGRESS NOTE    Donna Garrison  JSH:[id number] DOB: 1965/06/02 DOA: 07/29/2018 PCP: Orpah Melter, MD   Brief Narrative:  Per HPI: Donna Garrison is a 53 y.o. female with a history of GERD, depression, breast cancer.  Patient seen for right eye swelling and redness.  Started earlier this week with a pimple on the right side of her nose which progressed to pain and induration of the right cheek.  She saw her PCP on Thursday was placed on doxycycline and cefdinir. Her symptoms did not improve. No fevers, chills, nausea, vomiting.  Patient has been admitted for right eye periorbital cellulitis.  CT did not demonstrate any orbital involvement or abscess.  She has failed outpatient treatment and has been started on vancomycin and ceftriaxone.  Assessment & Plan:   Active Problems:   Periorbital cellulitis of right eye   1. Right eye periorbital cellulitis with failed outpatient therapy.  Continue on IV vancomycin and Rocephin until further clinical improvement noted.  Ibuprofen for pain related symptoms.  Also prescribed Visine for eye irritation today.  CBC within normal limits; no need to repeat labs in a.m. 2. GERD.  Maintain on famotidine. 3. Depression.  Maintain on Cymbalta.   DVT prophylaxis: Lovenox Code Status: Full Family Communication: Husband at bedside Disposition Plan: Continue on ongoing IV vancomycin and Rocephin until further clinical improvement.  Anticipate discharge in 24 to 48 hours once improved on ongoing outpatient antibiotic treatment.   Consultants:   None  Procedures:   None  Antimicrobials:   Vancomycin and Rocephin 3/14->   Subjective: Patient seen and evaluated today with no new acute complaints or concerns. No acute concerns or events noted overnight.  She continues to have right eye puffiness and tenderness as well as erythema with minimal improvement noted thus far.  She denies any visual changes, but does have some eye dryness and minimal  discharge.  Objective: Vitals:   07/29/18 1848 07/29/18 2235 07/30/18 0544 07/30/18 0856  BP: 118/74 114/83 110/64   Pulse: 81 78 74   Resp: 16  16   Temp: 98.5 F (36.9 C) 98 F (36.7 C) 97.9 F (36.6 C)   TempSrc: Oral Oral Oral   SpO2: 100% 98% 99% 96%  Weight:      Height:        Intake/Output Summary (Last 24 hours) at 07/30/2018 1055 Last data filed at 07/30/2018 0400 Gross per 24 hour  Intake 393.46 ml  Output --  Net 393.46 ml   Filed Weights   07/29/18 1046 07/29/18 1847  Weight: 95.3 kg 95.3 kg    Examination:  General exam: Appears calm and comfortable  Respiratory system: Clear to auscultation. Respiratory effort normal. Cardiovascular system: S1 & S2 heard, RRR. No JVD, murmurs, rubs, gallops or clicks. No pedal edema. Gastrointestinal system: Abdomen is nondistended, soft and nontender. No organomegaly or masses felt. Normal bowel sounds heard. Central nervous system: Alert and oriented. No focal neurological deficits. Extremities: Symmetric 5 x 5 power. Skin: No rashes, lesions or ulcers, right periorbital region with erythema and swelling noted to the eyelids. Psychiatry: Judgement and insight appear normal. Mood & affect appropriate.     Data Reviewed: I have personally reviewed following labs and imaging studies  CBC: Recent Labs  Lab 07/29/18 1230  WBC 9.4  NEUTROABS 6.1  HGB 13.5  HCT 41.9  MCV 90.5  PLT 850   Basic Metabolic Panel: Recent Labs  Lab 07/29/18 1230  NA 140  K 3.9  CL 106  CO2 26  GLUCOSE 87  BUN 13  CREATININE 0.75  CALCIUM 9.1   GFR: Estimated Creatinine Clearance: 95.7 mL/min (by C-G formula based on SCr of 0.75 mg/dL). Liver Function Tests: No results for input(s): AST, ALT, ALKPHOS, BILITOT, PROT, ALBUMIN in the last 168 hours. No results for input(s): LIPASE, AMYLASE in the last 168 hours. No results for input(s): AMMONIA in the last 168 hours. Coagulation Profile: No results for input(s): INR, PROTIME  in the last 168 hours. Cardiac Enzymes: No results for input(s): CKTOTAL, CKMB, CKMBINDEX, TROPONINI in the last 168 hours. BNP (last 3 results) No results for input(s): PROBNP in the last 8760 hours. HbA1C: No results for input(s): HGBA1C in the last 72 hours. CBG: No results for input(s): GLUCAP in the last 168 hours. Lipid Profile: No results for input(s): CHOL, HDL, LDLCALC, TRIG, CHOLHDL, LDLDIRECT in the last 72 hours. Thyroid Function Tests: No results for input(s): TSH, T4TOTAL, FREET4, T3FREE, THYROIDAB in the last 72 hours. Anemia Panel: No results for input(s): VITAMINB12, FOLATE, FERRITIN, TIBC, IRON, RETICCTPCT in the last 72 hours. Sepsis Labs: No results for input(s): PROCALCITON, LATICACIDVEN in the last 168 hours.  Recent Results (from the past 240 hour(s))  MRSA PCR Screening     Status: None   Collection Time: 07/29/18  4:33 PM  Result Value Ref Range Status   MRSA by PCR NEGATIVE NEGATIVE Final    Comment:        The GeneXpert MRSA Assay (FDA approved for NASAL specimens only), is one component of a comprehensive MRSA colonization surveillance program. It is not intended to diagnose MRSA infection nor to guide or monitor treatment for MRSA infections. Performed at [facility], [address] [place], [place] [address]          Radiology Studies: Ct Maxillofacial W Contrast  Result Date: 07/29/2018 CLINICAL DATA:  53 year old female treated for periorbital cellulitis with antibiotics for 2 days. Progressive symptoms. EXAM: CT MAXILLOFACIAL WITH CONTRAST TECHNIQUE: Multidetector CT imaging of the maxillofacial structures was performed with intravenous contrast. Multiplanar CT image reconstructions were also generated. CONTRAST:  58mL OMNIPAQUE IOHEXOL 300 MG/ML  SOLN COMPARISON:  None. FINDINGS: Osseous: Mandible and facial bones appear intact. No dental abnormality identified. Intact central skull base and visible cervical spine. No acute osseous  abnormality identified. Orbits: Intact orbital walls. Asymmetric periorbital soft tissue thickening and swelling on the right (series 2, image 29). The right globe remains intact. The bilateral postseptal soft tissues appears symmetric and normal. Associated soft tissue thickening and swelling continues along the premalar space and bridge of the nose greater on the right. No soft tissue gas. No fluid collection identified. Mild if any preseptal inflammation on the left. Sinuses: Clear aside from mild right maxillary alveolar recess mucosal thickening. Negative nasal cavity. Soft tissues: Negative visible thyroid, larynx, pharynx, parapharyngeal spaces, retropharyngeal space, sublingual space, submandibular spaces, parotid spaces, and masticator spaces. Mild reactive appearing right level 1 B lymph node enlargement. No cystic or heterogeneous nodes. Level 2 nodes appear symmetric and normal. Major vascular structures in the neck and at the skull base are patent. Limited intracranial: The cavernous sinus appears symmetric and patent. Negative visualized brain parenchyma. IMPRESSION: Right side preseptal and facial cellulitis. No postseptal involvement, soft tissue gas, abscess, or complicating features. Electronically Signed   By: Genevie Ann M.D.   On: 07/29/2018 14:06        Scheduled Meds:  DULoxetine  60 mg Oral Daily   enoxaparin (LOVENOX)  injection  40 mg Subcutaneous Q24H   famotidine  20 mg Oral Daily   ibuprofen  200 mg Oral Once   loratadine  10 mg Oral Daily   Continuous Infusions:  cefTRIAXone (ROCEPHIN)  IV     vancomycin 1,000 mg (07/30/18 0921)     LOS: 1 day    Time spent: 30 minutes    Dimitris Shanahan Darleen Crocker, DO [facility] Pager [phone]  If 7PM-7AM, please contact night-coverage [email or web address] Password [facility] 07/30/2018, 10:55 AM

## 2018-07-30 NOTE — Progress Notes (Signed)
Does not want to take vicodin.  Received order for ibuprophen.

## 2018-07-31 DIAGNOSIS — F32A Depression, unspecified: Secondary | ICD-10-CM

## 2018-07-31 DIAGNOSIS — E66811 Obesity, class 1: Secondary | ICD-10-CM

## 2018-07-31 DIAGNOSIS — F329 Major depressive disorder, single episode, unspecified: Secondary | ICD-10-CM

## 2018-07-31 DIAGNOSIS — E669 Obesity, unspecified: Secondary | ICD-10-CM

## 2018-07-31 DIAGNOSIS — K219 Gastro-esophageal reflux disease without esophagitis: Secondary | ICD-10-CM

## 2018-07-31 LAB — BRAIN NATRIURETIC PEPTIDE: B NATRIURETIC PEPTIDE 5: 28 pg/mL (ref 0.0–100.0)

## 2018-07-31 LAB — TROPONIN I
Troponin I: <0.03 ng/mL (ref <0.03)
Troponin I: <0.03 ng/mL (ref <0.03)
Troponin I: <0.03 ng/mL (ref <0.03)

## 2018-07-31 MED ORDER — CEFDINIR 300 MG PO CAPS: 300.0000 mg | ORAL_CAPSULE | Freq: Two times a day (BID) | ORAL | 0 refills | Status: AC

## 2018-07-31 MED ORDER — IBUPROFEN 800 MG PO TABS: 800.0000 mg | ORAL_TABLET | Freq: Three times a day (TID) | ORAL | 0 refills | Status: AC | PRN

## 2018-07-31 MED ORDER — CLINDAMYCIN HCL 150 MG PO CAPS
300.0000 mg | ORAL_CAPSULE | Freq: Three times a day (TID) | ORAL | Status: DC
  Administered 2018-07-31: 300 mg via ORAL
  Filled 2018-07-31: qty 2

## 2018-07-31 MED ORDER — CLINDAMYCIN HCL 300 MG PO CAPS: 300.0000 mg | ORAL_CAPSULE | Freq: Three times a day (TID) | ORAL | 0 refills | Status: AC

## 2018-07-31 MED ORDER — PANTOPRAZOLE SODIUM 40 MG PO TBEC
40.0000 mg | DELAYED_RELEASE_TABLET | Freq: Every day | ORAL | Status: DC
  Administered 2018-07-31: 40 mg via ORAL
  Filled 2018-07-31: qty 1

## 2018-07-31 MED ORDER — CEFDINIR 300 MG PO CAPS: 300.0000 mg | ORAL_CAPSULE | Freq: Two times a day (BID) | ORAL | Status: DC

## 2018-07-31 NOTE — Progress Notes (Signed)
IV removed and discharge instructions reviewed.  Husband to drive home 

## 2018-07-31 NOTE — Discharge Summary (Signed)
Physician Discharge Summary  Donna Garrison:[id number] DOB: [birth year] DOA: 07/29/2018  PCP: Orpah Melter, MD  Admit date: 07/29/2018 Discharge date: 07/31/2018  Time spent: 35 minutes  Recommendations for Outpatient Follow-up:  1. Reassess complete resolution of right-sided facial/preseptal cellulitis. 2. Repeat basic metabolic panel to follow electrolytes and renal function.   Discharge Diagnoses:  Active Problems:   Periorbital cellulitis of right eye   Class 1 obesity   Depression   Gastroesophageal reflux disease Allergy rhinitis  Discharge Condition: Stable and improved.  Patient discharged home with instruction to follow-up with PCP in 10 days.  Diet recommendation: Low calorie diet.  Filed Weights   07/29/18 1046 07/29/18 1847  Weight: 95.3 kg 95.3 kg    History of present illness:  As per H&P written by Dr. Nehemiah Settle on 07/29/2018 53 y.o. female with a history of GERD, depression, breast cancer.  Patient seen for right eye swelling and redness.  Started earlier this week with a pimple on the right side of her nose which progressed to pain and induration of the right cheek.  She saw her PCP on Thursday was placed on doxycycline and cefdinir. Her symptoms did not improve. No fevers, chills, nausea, vomiting.  Emergency Department Course: CT showed no evidence of orbital involvement or abscess.  Hospital Course:  1-right-sided facial/preseptal periorbital cellulitis -Significantly improve with the use of IV antibiotics -No fever, no elevation of WBCs, no nausea, no vomiting. -Patient discharged home with instructions to proceed antibiotic therapy using clindamycin and cefdinir for another 10 more days -Instructed to apply cold compresses 3 times a day and to use NSAIDs to help with swelling and pain. -Outpatient follow-up with PCP in 10 days.  2-depression -No suicidal ideation -Overall stable mood -Continue Cymbalta  3-gastroesophageal reflux  disease -Continue H2 blockers  4-class I obesity -Portion control, low calorie diet and increase physical activity discussed with patient -Body mass index is 33.89 kg/m.  5-history of allergy rhinitis -Continue the use of Allegra -No signs of nasal congestion or postnasal drip at time of discharge.  Procedures: See below for x-ray reports; with CT maxillofacial demonstrating right-sided preseptal and facial cellulitis.  Consultations:  None  Discharge Exam: Vitals:   07/30/18 2145 07/31/18 0505  BP: 113/87 103/70  Pulse: 83 76  Resp: 16 15  Temp: 98.4 F (36.9 C) 98 F (36.7 C)  SpO2: 98% 96%    General: Afebrile, no nausea, no vomiting, no abdominal pain.  Significant improvement in pain/swelling of the right side of her face from cellulitic process. Cardiovascular: S1 and S2, no rubs, no gallops, no murmurs. Respiratory: Clear to auscultation bilaterally Abdomen: Soft, nontender, nondistended, positive bowel sounds Extremities: No cyanosis, no clubbing.  Discharge Instructions   Discharge Instructions    Discharge instructions   Complete by:  As directed    Take medications as prescribed. Keep yourself well-hydrated. Arrange follow-up with PCP in 10 days. Apply cool compresses 5 to 7 minutes 3 times a day to affected area. Use as needed ibuprofen to help controlling pain, swelling and fever.     Allergies as of 07/31/2018      Reactions   Penicillins Hives   Sulfa Antibiotics Hives   Letrozole Other (See Comments)   Muscle aches      Medication List    STOP taking these medications   doxycycline 100 MG capsule Commonly known as:  VIBRAMYCIN   HYDROcodone-acetaminophen 5-325 MG tablet Commonly known as:  Norco     TAKE these medications  cefdinir 300 MG capsule Commonly known as:  OMNICEF Take 1 capsule (300 mg total) by mouth every 12 (twelve) hours for 10 days. What changed:    when to take this  additional instructions   cimetidine 200  MG tablet Commonly known as:  TAGAMET Take 200 mg by mouth 2 (two) times daily.   clindamycin 300 MG capsule Commonly known as:  CLEOCIN Take 1 capsule (300 mg total) by mouth every 8 (eight) hours for 10 days.   Collagen 500 MG Caps Take by mouth.   DULoxetine 60 MG capsule Commonly known as:  CYMBALTA Take 60 mg by mouth daily.   fexofenadine 30 MG tablet Commonly known as:  ALLEGRA Take 30 mg by mouth 2 (two) times daily.   Glucosamine 1500 Complex Caps Take by mouth.   ibuprofen 800 MG tablet Commonly known as:  ADVIL,MOTRIN Take 1 tablet (800 mg total) by mouth every 8 (eight) hours as needed for moderate pain. What changed:    when to take this  reasons to take this   Milk Thistle 1000 MG Caps Take by mouth.   multivitamin tablet Take by mouth.   Oscal 500/200 D-3 500-200 MG-UNIT tablet Generic drug:  calcium-vitamin D Take 1 tablet by mouth.      Allergies  Allergen Reactions  . Penicillins Hives  . Sulfa Antibiotics Hives  . Letrozole Other (See Comments)    Muscle aches   Follow-up Information    Orpah Melter, MD. Schedule an appointment as soon as possible for a visit in 10 day(s).   Specialty:  Family Medicine Contact information: [address] [place] [address] [phone]            The results of significant diagnostics from this hospitalization (including imaging, microbiology, ancillary and laboratory) are listed below for reference.    Significant Diagnostic Studies: Ct Maxillofacial W Contrast  Result Date: 07/29/2018 CLINICAL DATA:  53 year old female treated for periorbital cellulitis with antibiotics for 2 days. Progressive symptoms. EXAM: CT MAXILLOFACIAL WITH CONTRAST TECHNIQUE: Multidetector CT imaging of the maxillofacial structures was performed with intravenous contrast. Multiplanar CT image reconstructions were also generated. CONTRAST:  29mL OMNIPAQUE IOHEXOL 300 MG/ML  SOLN COMPARISON:  None. FINDINGS:  Osseous: Mandible and facial bones appear intact. No dental abnormality identified. Intact central skull base and visible cervical spine. No acute osseous abnormality identified. Orbits: Intact orbital walls. Asymmetric periorbital soft tissue thickening and swelling on the right (series 2, image 29). The right globe remains intact. The bilateral postseptal soft tissues appears symmetric and normal. Associated soft tissue thickening and swelling continues along the premalar space and bridge of the nose greater on the right. No soft tissue gas. No fluid collection identified. Mild if any preseptal inflammation on the left. Sinuses: Clear aside from mild right maxillary alveolar recess mucosal thickening. Negative nasal cavity. Soft tissues: Negative visible thyroid, larynx, pharynx, parapharyngeal spaces, retropharyngeal space, sublingual space, submandibular spaces, parotid spaces, and masticator spaces. Mild reactive appearing right level 1 B lymph node enlargement. No cystic or heterogeneous nodes. Level 2 nodes appear symmetric and normal. Major vascular structures in the neck and at the skull base are patent. Limited intracranial: The cavernous sinus appears symmetric and patent. Negative visualized brain parenchyma. IMPRESSION: Right side preseptal and facial cellulitis. No postseptal involvement, soft tissue gas, abscess, or complicating features. Electronically Signed   By: Genevie Ann M.D.   On: 07/29/2018 14:06    Microbiology: Recent Results (from the past 240 hour(s))  MRSA PCR Screening     Status: None   Collection Time: 07/29/18  4:33 PM  Result Value Ref Range Status   MRSA by PCR NEGATIVE NEGATIVE Final    Comment:        The GeneXpert MRSA Assay (FDA approved for NASAL specimens only), is one component of a comprehensive MRSA colonization surveillance program. It is not intended to diagnose MRSA infection nor to guide or monitor treatment for MRSA infections. Performed at [facility], [address] [place], [place] [address]      Labs: Basic Metabolic Panel: Recent Labs  Lab 07/29/18 1230  NA 140  K 3.9  CL 106  CO2 26  GLUCOSE 87  BUN 13  CREATININE 0.75  CALCIUM 9.1   CBC: Recent Labs  Lab 07/29/18 1230  WBC 9.4  NEUTROABS 6.1  HGB 13.5  HCT 41.9  MCV 90.5  PLT 219   Cardiac Enzymes: Recent Labs  Lab 07/31/18 0047 07/31/18 0627 07/31/18 1225  TROPONINI <0.03 <0.03 <0.03   BNP: BNP (last 3 results) Recent Labs    07/31/18 0047  BNP 28.0    Signed:  Barton Dubois MD.  [facility] 07/31/2018, 2:27 PM

## 2018-07-31 NOTE — Progress Notes (Signed)
Patient c/o nausea, chest pain radiating from center up with discomfort into right shoulder. zofran given per PRN order. [facility] paged for orders. EKG obtained. Waiting orders from [facility]

## 2018-07-31 NOTE — Progress Notes (Signed)
Denies chest pain this morning but rates periorbital pain a 5.  Edema is improved from yesterday

## 2018-08-01 LAB — HIV ANTIBODY (ROUTINE TESTING W REFLEX): HIV Screen 4th Generation wRfx: NONREACTIVE

## 2018-08-14 DIAGNOSIS — Z0000 Encounter for general adult medical examination without abnormal findings: Secondary | ICD-10-CM | POA: Diagnosis not present

## 2018-08-14 DIAGNOSIS — Z13220 Encounter for screening for lipoid disorders: Secondary | ICD-10-CM | POA: Diagnosis not present

## 2018-08-14 DIAGNOSIS — Z131 Encounter for screening for diabetes mellitus: Secondary | ICD-10-CM | POA: Diagnosis not present

## 2018-08-29 DIAGNOSIS — L0201 Cutaneous abscess of face: Secondary | ICD-10-CM | POA: Diagnosis not present

## 2018-10-31 DIAGNOSIS — L7211 Pilar cyst: Secondary | ICD-10-CM | POA: Diagnosis not present

## 2018-10-31 DIAGNOSIS — L720 Epidermal cyst: Secondary | ICD-10-CM | POA: Diagnosis not present
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
## 2018-12-13 DIAGNOSIS — L720 Epidermal cyst: Secondary | ICD-10-CM | POA: Diagnosis not present

## 2018-12-13 DIAGNOSIS — L723 Sebaceous cyst: Secondary | ICD-10-CM | POA: Diagnosis not present

## 2019-03-07 DIAGNOSIS — K7689 Other specified diseases of liver: Secondary | ICD-10-CM | POA: Diagnosis not present

## 2019-03-07 DIAGNOSIS — Z9010 Acquired absence of unspecified breast and nipple: Secondary | ICD-10-CM | POA: Diagnosis not present

## 2019-03-07 DIAGNOSIS — Z9221 Personal history of antineoplastic chemotherapy: Secondary | ICD-10-CM | POA: Diagnosis not present

## 2019-03-07 DIAGNOSIS — Z853 Personal history of malignant neoplasm of breast: Secondary | ICD-10-CM | POA: Diagnosis not present

## 2019-03-07 DIAGNOSIS — K769 Liver disease, unspecified: Secondary | ICD-10-CM | POA: Diagnosis not present

## 2019-03-07 DIAGNOSIS — Z9223 Personal history of estrogen therapy: Secondary | ICD-10-CM | POA: Diagnosis not present

## 2019-03-21 DIAGNOSIS — K7689 Other specified diseases of liver: Secondary | ICD-10-CM | POA: Diagnosis not present

## 2019-03-21 DIAGNOSIS — K8020 Calculus of gallbladder without cholecystitis without obstruction: Secondary | ICD-10-CM | POA: Diagnosis not present

## 2019-08-02 DIAGNOSIS — Z87898 Personal history of other specified conditions: Secondary | ICD-10-CM | POA: Diagnosis not present

## 2019-08-02 DIAGNOSIS — C50811 Malignant neoplasm of overlapping sites of right female breast: Secondary | ICD-10-CM | POA: Diagnosis not present

## 2019-08-02 DIAGNOSIS — C50911 Malignant neoplasm of unspecified site of right female breast: Secondary | ICD-10-CM | POA: Diagnosis not present

## 2019-08-02 DIAGNOSIS — N649 Disorder of breast, unspecified: Secondary | ICD-10-CM | POA: Diagnosis not present

## 2019-08-02 DIAGNOSIS — Z08 Encounter for follow-up examination after completed treatment for malignant neoplasm: Secondary | ICD-10-CM | POA: Diagnosis not present

## 2019-08-02 DIAGNOSIS — L989 Disorder of the skin and subcutaneous tissue, unspecified: Secondary | ICD-10-CM | POA: Diagnosis not present

## 2019-08-02 DIAGNOSIS — K8020 Calculus of gallbladder without cholecystitis without obstruction: Secondary | ICD-10-CM | POA: Diagnosis not present

## 2019-08-02 DIAGNOSIS — Z9889 Other specified postprocedural states: Secondary | ICD-10-CM | POA: Diagnosis not present

## 2019-08-02 DIAGNOSIS — Z853 Personal history of malignant neoplasm of breast: Secondary | ICD-10-CM | POA: Diagnosis not present

## 2019-08-02 DIAGNOSIS — K769 Liver disease, unspecified: Secondary | ICD-10-CM | POA: Diagnosis not present

## 2019-08-15 DIAGNOSIS — Z0000 Encounter for general adult medical examination without abnormal findings: Secondary | ICD-10-CM | POA: Diagnosis not present

## 2019-08-15 DIAGNOSIS — Z131 Encounter for screening for diabetes mellitus: Secondary | ICD-10-CM | POA: Diagnosis not present

## 2019-08-15 DIAGNOSIS — Z13220 Encounter for screening for lipoid disorders: Secondary | ICD-10-CM | POA: Diagnosis not present

## 2019-08-15 DIAGNOSIS — Z8349 Family history of other endocrine, nutritional and metabolic diseases: Secondary | ICD-10-CM | POA: Diagnosis not present

## 2020-03-18 DIAGNOSIS — G5622 Lesion of ulnar nerve, left upper limb: Secondary | ICD-10-CM | POA: Diagnosis not present

## 2020-04-02 DIAGNOSIS — G5622 Lesion of ulnar nerve, left upper limb: Secondary | ICD-10-CM | POA: Diagnosis not present

## 2020-04-22 DIAGNOSIS — G5622 Lesion of ulnar nerve, left upper limb: Secondary | ICD-10-CM | POA: Diagnosis not present

## 2020-06-11 ENCOUNTER — Other Ambulatory Visit: Payer: Self-pay | Admitting: Orthopedic Surgery

## 2020-06-11 DIAGNOSIS — G5622 Lesion of ulnar nerve, left upper limb: Secondary | ICD-10-CM | POA: Diagnosis not present

## 2020-06-13 ENCOUNTER — Other Ambulatory Visit: Payer: Self-pay

## 2020-06-13 ENCOUNTER — Encounter (HOSPITAL_BASED_OUTPATIENT_CLINIC_OR_DEPARTMENT_OTHER): Payer: Self-pay | Admitting: Orthopedic Surgery

## 2020-06-16 ENCOUNTER — Other Ambulatory Visit (HOSPITAL_COMMUNITY): Payer: BC Managed Care – PPO

## 2020-06-17 ENCOUNTER — Other Ambulatory Visit (HOSPITAL_COMMUNITY)
Admission: RE | Admit: 2020-06-17 | Discharge: 2020-06-17 | Disposition: A | Discharge status: Home / Self Care | Payer: BC Managed Care – PPO | Source: Ambulatory Visit | Attending: Orthopedic Surgery | Admitting: Orthopedic Surgery

## 2020-06-17 DIAGNOSIS — Z01812 Encounter for preprocedural laboratory examination: Secondary | ICD-10-CM | POA: Insufficient documentation

## 2020-06-17 DIAGNOSIS — Z20822 Contact with and (suspected) exposure to covid-19: Secondary | ICD-10-CM | POA: Insufficient documentation

## 2020-06-17 DIAGNOSIS — Z880 Allergy status to penicillin: Secondary | ICD-10-CM | POA: Diagnosis not present

## 2020-06-17 DIAGNOSIS — Z853 Personal history of malignant neoplasm of breast: Secondary | ICD-10-CM | POA: Diagnosis not present

## 2020-06-17 DIAGNOSIS — G5622 Lesion of ulnar nerve, left upper limb: Secondary | ICD-10-CM | POA: Diagnosis not present

## 2020-06-17 DIAGNOSIS — Z882 Allergy status to sulfonamides status: Secondary | ICD-10-CM | POA: Diagnosis not present

## 2020-06-17 DIAGNOSIS — Z87891 Personal history of nicotine dependence: Secondary | ICD-10-CM | POA: Diagnosis not present

## 2020-06-17 LAB — SARS CORONAVIRUS 2 (TAT 6-24 HRS): SARS Coronavirus 2: NEGATIVE

## 2020-06-18 NOTE — Progress Notes (Signed)

## 2020-06-19 ENCOUNTER — Ambulatory Visit (HOSPITAL_BASED_OUTPATIENT_CLINIC_OR_DEPARTMENT_OTHER): Payer: BC Managed Care – PPO | Admitting: Anesthesiology

## 2020-06-19 ENCOUNTER — Encounter (HOSPITAL_BASED_OUTPATIENT_CLINIC_OR_DEPARTMENT_OTHER): Payer: Self-pay | Admitting: Orthopedic Surgery

## 2020-06-19 ENCOUNTER — Other Ambulatory Visit: Payer: Self-pay

## 2020-06-19 ENCOUNTER — Ambulatory Visit (HOSPITAL_BASED_OUTPATIENT_CLINIC_OR_DEPARTMENT_OTHER)
Admission: RE | Admit: 2020-06-19 | Discharge: 2020-06-19 | Disposition: A | Discharge status: Home / Self Care | Payer: BC Managed Care – PPO | Attending: Orthopedic Surgery | Admitting: Orthopedic Surgery

## 2020-06-19 ENCOUNTER — Encounter (HOSPITAL_BASED_OUTPATIENT_CLINIC_OR_DEPARTMENT_OTHER)
Admission: RE | Disposition: A | Discharge status: Home / Self Care | Payer: Self-pay | Source: Home / Self Care | Attending: Orthopedic Surgery

## 2020-06-19 DIAGNOSIS — K219 Gastro-esophageal reflux disease without esophagitis: Secondary | ICD-10-CM | POA: Diagnosis not present

## 2020-06-19 DIAGNOSIS — Z853 Personal history of malignant neoplasm of breast: Secondary | ICD-10-CM | POA: Diagnosis not present

## 2020-06-19 DIAGNOSIS — G5622 Lesion of ulnar nerve, left upper limb: Secondary | ICD-10-CM | POA: Insufficient documentation

## 2020-06-19 DIAGNOSIS — Z882 Allergy status to sulfonamides status: Secondary | ICD-10-CM | POA: Insufficient documentation

## 2020-06-19 DIAGNOSIS — Z20822 Contact with and (suspected) exposure to covid-19: Secondary | ICD-10-CM | POA: Insufficient documentation

## 2020-06-19 DIAGNOSIS — Z880 Allergy status to penicillin: Secondary | ICD-10-CM | POA: Insufficient documentation

## 2020-06-19 DIAGNOSIS — C50911 Malignant neoplasm of unspecified site of right female breast: Secondary | ICD-10-CM | POA: Diagnosis not present

## 2020-06-19 DIAGNOSIS — G8918 Other acute postprocedural pain: Secondary | ICD-10-CM | POA: Diagnosis not present

## 2020-06-19 DIAGNOSIS — Z87891 Personal history of nicotine dependence: Secondary | ICD-10-CM | POA: Insufficient documentation

## 2020-06-19 HISTORY — PX: ULNAR NERVE TRANSPOSITION: SHX2595

## 2020-06-19 SURGERY — ULNAR NERVE DECOMPRESSION/TRANSPOSITION
Anesthesia: General | Site: Elbow | Laterality: Left

## 2020-06-19 MED ORDER — CLINDAMYCIN PHOSPHATE 900 MG/50ML IV SOLN
900.0000 mg | INTRAVENOUS | Status: AC
  Administered 2020-06-19: 900 mg via INTRAVENOUS

## 2020-06-19 MED ORDER — DEXAMETHASONE SODIUM PHOSPHATE 10 MG/ML IJ SOLN
INTRAMUSCULAR | Status: DC | PRN
  Administered 2020-06-19: 10 mg via INTRAVENOUS

## 2020-06-19 MED ORDER — ACETAMINOPHEN 500 MG PO TABS
1000.0000 mg | ORAL_TABLET | Freq: Once | ORAL | Status: AC
  Administered 2020-06-19: 1000 mg via ORAL

## 2020-06-19 MED ORDER — BUPIVACAINE-EPINEPHRINE (PF) 0.5% -1:200000 IJ SOLN
INTRAMUSCULAR | Status: DC | PRN
  Administered 2020-06-19: 30 mL via PERINEURAL

## 2020-06-19 MED ORDER — MIDAZOLAM HCL 2 MG/2ML IJ SOLN
INTRAMUSCULAR | Status: AC
  Filled 2020-06-19: qty 2

## 2020-06-19 MED ORDER — FENTANYL CITRATE (PF) 100 MCG/2ML IJ SOLN
50.0000 ug | Freq: Once | INTRAMUSCULAR | Status: AC
  Administered 2020-06-19: 50 ug via INTRAVENOUS

## 2020-06-19 MED ORDER — LIDOCAINE 2% (20 MG/ML) 5 ML SYRINGE
INTRAMUSCULAR | Status: DC | PRN
  Administered 2020-06-19: 20 mg via INTRAVENOUS

## 2020-06-19 MED ORDER — 0.9 % SODIUM CHLORIDE (POUR BTL) OPTIME
TOPICAL | Status: DC | PRN
  Administered 2020-06-19: 120 mL

## 2020-06-19 MED ORDER — SCOPOLAMINE 1 MG/3DAYS TD PT72
1.0000 | MEDICATED_PATCH | TRANSDERMAL | Status: DC
  Administered 2020-06-19: 1.5 mg via TRANSDERMAL

## 2020-06-19 MED ORDER — LACTATED RINGERS IV SOLN: INTRAVENOUS | Status: DC

## 2020-06-19 MED ORDER — PROMETHAZINE HCL 25 MG/ML IJ SOLN: 6.2500 mg | INTRAMUSCULAR | Status: DC | PRN

## 2020-06-19 MED ORDER — PROPOFOL 10 MG/ML IV BOLUS
INTRAVENOUS | Status: DC | PRN
  Administered 2020-06-19: 200 mg via INTRAVENOUS

## 2020-06-19 MED ORDER — FENTANYL CITRATE (PF) 100 MCG/2ML IJ SOLN
INTRAMUSCULAR | Status: AC
  Filled 2020-06-19: qty 2

## 2020-06-19 MED ORDER — OXYCODONE HCL 5 MG/5ML PO SOLN: 5.0000 mg | Freq: Once | ORAL | Status: DC | PRN

## 2020-06-19 MED ORDER — ONDANSETRON HCL 4 MG/2ML IJ SOLN
INTRAMUSCULAR | Status: DC | PRN
  Administered 2020-06-19: 4 mg via INTRAVENOUS

## 2020-06-19 MED ORDER — HYDROMORPHONE HCL 1 MG/ML IJ SOLN: 0.2500 mg | INTRAMUSCULAR | Status: DC | PRN

## 2020-06-19 MED ORDER — LIDOCAINE HCL (PF) 1 % IJ SOLN
INTRAMUSCULAR | Status: AC
  Filled 2020-06-19: qty 30

## 2020-06-19 MED ORDER — OXYCODONE HCL 5 MG PO TABS: 5.0000 mg | ORAL_TABLET | Freq: Once | ORAL | Status: DC | PRN

## 2020-06-19 MED ORDER — BUPIVACAINE HCL (PF) 0.25 % IJ SOLN
INTRAMUSCULAR | Status: AC
  Filled 2020-06-19: qty 30

## 2020-06-19 MED ORDER — PROPOFOL 10 MG/ML IV BOLUS
INTRAVENOUS | Status: AC
  Filled 2020-06-19: qty 20

## 2020-06-19 MED ORDER — MIDAZOLAM HCL 2 MG/2ML IJ SOLN: 0.5000 mg | Freq: Once | INTRAMUSCULAR | Status: DC | PRN

## 2020-06-19 MED ORDER — SCOPOLAMINE 1 MG/3DAYS TD PT72
MEDICATED_PATCH | TRANSDERMAL | Status: AC
  Filled 2020-06-19: qty 1

## 2020-06-19 MED ORDER — ONDANSETRON HCL 4 MG/2ML IJ SOLN
INTRAMUSCULAR | Status: AC
  Filled 2020-06-19: qty 2

## 2020-06-19 MED ORDER — TRAMADOL HCL 50 MG PO TABS: 50.0000 mg | ORAL_TABLET | Freq: Four times a day (QID) | ORAL | 0 refills | Status: AC | PRN

## 2020-06-19 MED ORDER — CLINDAMYCIN PHOSPHATE 900 MG/50ML IV SOLN
INTRAVENOUS | Status: AC
  Filled 2020-06-19: qty 50

## 2020-06-19 MED ORDER — DEXAMETHASONE SODIUM PHOSPHATE 10 MG/ML IJ SOLN
INTRAMUSCULAR | Status: AC
  Filled 2020-06-19: qty 1

## 2020-06-19 MED ORDER — ACETAMINOPHEN 500 MG PO TABS
ORAL_TABLET | ORAL | Status: AC
  Filled 2020-06-19: qty 2

## 2020-06-19 MED ORDER — MIDAZOLAM HCL 2 MG/2ML IJ SOLN
2.0000 mg | Freq: Once | INTRAMUSCULAR | Status: AC
  Administered 2020-06-19: 2 mg via INTRAVENOUS

## 2020-06-19 MED ORDER — PHENYLEPHRINE 40 MCG/ML (10ML) SYRINGE FOR IV PUSH (FOR BLOOD PRESSURE SUPPORT)
PREFILLED_SYRINGE | INTRAVENOUS | Status: DC | PRN
  Administered 2020-06-19: 80 ug via INTRAVENOUS

## 2020-06-19 MED ORDER — MEPERIDINE HCL 25 MG/ML IJ SOLN: 6.2500 mg | INTRAMUSCULAR | Status: DC | PRN

## 2020-06-19 SURGICAL SUPPLY — 52 items
BLADE MINI RND TIP GREEN BEAV (BLADE) IMPLANT
BLADE SURG 15 STRL LF DISP TIS (BLADE) ×1 IMPLANT
BLADE SURG 15 STRL SS (BLADE) ×1
BNDG COHESIVE 3X5 TAN STRL LF (GAUZE/BANDAGES/DRESSINGS) ×4 IMPLANT
BNDG ESMARK 4X9 LF (GAUZE/BANDAGES/DRESSINGS) ×2 IMPLANT
BNDG GAUZE ELAST 4 BULKY (GAUZE/BANDAGES/DRESSINGS) ×2 IMPLANT
CHLORAPREP W/TINT 26 (MISCELLANEOUS) ×2 IMPLANT
CORD BIPOLAR FORCEPS 12FT (ELECTRODE) ×2 IMPLANT
COVER BACK TABLE 60X90IN (DRAPES) ×2 IMPLANT
COVER MAYO STAND STRL (DRAPES) ×2 IMPLANT
COVER WAND RF STERILE (DRAPES) IMPLANT
CUFF TOURN SGL QUICK 18X3 (MISCELLANEOUS) ×2 IMPLANT
DECANTER SPIKE VIAL GLASS SM (MISCELLANEOUS) IMPLANT
DRAPE EXTREMITY T 121X128X90 (DISPOSABLE) ×2 IMPLANT
DRAPE SURG 17X23 STRL (DRAPES) ×2 IMPLANT
DRSG PAD ABDOMINAL 8X10 ST (GAUZE/BANDAGES/DRESSINGS) ×2 IMPLANT
GAUZE 4X4 16PLY RFD (DISPOSABLE) ×2 IMPLANT
GAUZE SPONGE 4X4 12PLY STRL (GAUZE/BANDAGES/DRESSINGS) ×2 IMPLANT
GAUZE XEROFORM 1X8 LF (GAUZE/BANDAGES/DRESSINGS) ×2 IMPLANT
GLOVE BIO SURGEON STRL SZ7.5 (GLOVE) ×2 IMPLANT
GLOVE BIOGEL PI IND STRL 8.5 (GLOVE) ×1 IMPLANT
GLOVE BIOGEL PI INDICATOR 8.5 (GLOVE) ×1
GLOVE SRG 8 PF TXTR STRL LF DI (GLOVE) ×1 IMPLANT
GLOVE SURG ENC MOIS LTX SZ6.5 (GLOVE) ×6 IMPLANT
GLOVE SURG ORTHO LTX SZ8 (GLOVE) ×2 IMPLANT
GLOVE SURG UNDER POLY LF SZ6.5 (GLOVE) ×6 IMPLANT
GLOVE SURG UNDER POLY LF SZ8 (GLOVE) ×1
GOWN STRL REUS W/ TWL LRG LVL3 (GOWN DISPOSABLE) ×1 IMPLANT
GOWN STRL REUS W/ TWL XL LVL3 (GOWN DISPOSABLE) ×1 IMPLANT
GOWN STRL REUS W/TWL LRG LVL3 (GOWN DISPOSABLE) ×1
GOWN STRL REUS W/TWL XL LVL3 (GOWN DISPOSABLE) ×5 IMPLANT
LOOP VESSEL MAXI BLUE (MISCELLANEOUS) IMPLANT
NEEDLE PRECISIONGLIDE 27X1.5 (NEEDLE) IMPLANT
NS IRRIG 1000ML POUR BTL (IV SOLUTION) ×2 IMPLANT
PACK BASIN DAY SURGERY FS (CUSTOM PROCEDURE TRAY) ×2 IMPLANT
PAD CAST 3X4 CTTN HI CHSV (CAST SUPPLIES) ×1 IMPLANT
PAD CAST 4YDX4 CTTN HI CHSV (CAST SUPPLIES) ×1 IMPLANT
PADDING CAST COTTON 3X4 STRL (CAST SUPPLIES) ×1
PADDING CAST COTTON 4X4 STRL (CAST SUPPLIES) ×1
SLEEVE SCD COMPRESS KNEE MED (MISCELLANEOUS) ×2 IMPLANT
SLING ARM FOAM STRAP LRG (SOFTGOODS) ×2 IMPLANT
SPLINT PLASTER CAST XFAST 3X15 (CAST SUPPLIES) IMPLANT
SPLINT PLASTER XTRA FASTSET 3X (CAST SUPPLIES)
STOCKINETTE 4X48 STRL (DRAPES) ×2 IMPLANT
SUT ETHILON 4 0 PS 2 18 (SUTURE) ×2 IMPLANT
SUT VIC AB 2-0 SH 27 (SUTURE) ×1
SUT VIC AB 2-0 SH 27XBRD (SUTURE) ×1 IMPLANT
SUT VICRYL 4-0 PS2 18IN ABS (SUTURE) ×2 IMPLANT
SYR BULB EAR ULCER 3OZ GRN STR (SYRINGE) ×2 IMPLANT
SYR CONTROL 10ML LL (SYRINGE) IMPLANT
TOWEL GREEN STERILE FF (TOWEL DISPOSABLE) ×4 IMPLANT
UNDERPAD 30X36 HEAVY ABSORB (UNDERPADS AND DIAPERS) ×2 IMPLANT

## 2020-06-19 NOTE — Anesthesia Postprocedure Evaluation (Signed)
Anesthesia Post Note  Patient: Donna Garrison  Procedure(s) Performed: DECOMPRESSION ULNAR NERVE LEFT ELBOW (Left Elbow)     Patient location during evaluation: Phase II Anesthesia Type: General Level of consciousness: awake and alert, patient cooperative and oriented Pain management: pain level controlled Vital Signs Assessment: post-procedure vital signs reviewed and stable Respiratory status: spontaneous breathing, nonlabored ventilation and respiratory function stable Cardiovascular status: blood pressure returned to baseline and stable Postop Assessment: no apparent nausea or vomiting, adequate PO intake and able to ambulate Anesthetic complications: no   No complications documented.  Last Vitals:  Vitals:   06/19/20 1115 06/19/20 1139  BP: 98/63 105/71  Pulse: 93 97  Resp: 10 14  Temp:  36.6 C  SpO2: 99% 98%    Last Pain:  Vitals:   06/19/20 1130  TempSrc:   PainSc: 0-No pain                 Kenric Ginger,E. [place]

## 2020-06-19 NOTE — Anesthesia Preprocedure Evaluation (Addendum)
Anesthesia Evaluation  Patient identified by MRN, date of birth, ID band Patient awake    Reviewed: Allergy & Precautions, NPO status , Patient's Chart, lab work & pertinent test results  History of Anesthesia Complications Negative for: history of anesthetic complications  Airway Mallampati: II  TM Distance: >3 FB Neck ROM: Full    Dental  (+) Teeth Intact, Dental Advisory Given   Pulmonary former smoker,  06/17/2020 SARS coronavirus NEG   breath sounds clear to auscultation       Cardiovascular negative cardio ROS   Rhythm:Regular Rate:Normal     Neuro/Psych negative neurological ROS     GI/Hepatic Neg liver ROS, GERD  Medicated and Controlled,  Endo/Other  Morbid obesity  Renal/GU H/o stones     Musculoskeletal   Abdominal (+) + obese,   Peds  Hematology negative hematology ROS (+)   Anesthesia Other Findings H/o breast cancer  Reproductive/Obstetrics                            Anesthesia Physical Anesthesia Plan  ASA: III  Anesthesia Plan: General   Post-op Pain Management: GA combined w/ Regional for post-op pain   Induction: Intravenous  PONV Risk Score and Plan: 3 and Ondansetron, Dexamethasone and Scopolamine patch - Pre-op  Airway Management Planned: LMA  Additional Equipment: None  Intra-op Plan:   Post-operative Plan:   Informed Consent: I have reviewed the patients History and Physical, chart, labs and discussed the procedure including the risks, benefits and alternatives for the proposed anesthesia with the patient or authorized representative who has indicated his/her understanding and acceptance.     Dental advisory given  Plan Discussed with: CRNA and Surgeon  Anesthesia Plan Comments: (Plan routine monitors, GA- LMA OK, supraclavicular block for post op analgesia)       Anesthesia Quick Evaluation

## 2020-06-19 NOTE — Op Note (Signed)
I assisted Surgeon(s) and Role:    * Daryll Brod, MD - Primary    Leanora Cover, MD on the Procedure(s): [place] on 06/19/2020.  I provided assistance on this case as follows: retraction soft tissues, positioning of arm.  Electronically signed by: Leanora Cover, MD Date: 06/19/2020 Time: 10:45 AM

## 2020-06-19 NOTE — Anesthesia Procedure Notes (Signed)
Procedure Name: LMA Insertion Date/Time: 06/19/2020 10:11 AM Performed by: Myna Bright, CRNA Pre-anesthesia Checklist: Patient identified, Emergency Drugs available, Suction available and Patient being monitored Patient Re-evaluated:Patient Re-evaluated prior to induction Oxygen Delivery Method: Circle system utilized Preoxygenation: Pre-oxygenation with 100% oxygen Induction Type: IV induction Ventilation: Mask ventilation without difficulty LMA: LMA inserted LMA Size: 4.0 Number of attempts: 1 Placement Confirmation: positive ETCO2 and breath sounds checked- equal and bilateral Tube secured with: Tape Dental Injury: Teeth and Oropharynx as per pre-operative assessment

## 2020-06-19 NOTE — Discharge Instructions (Addendum)
[facility] Center Instructions Hand Surgery  Wound Care: Keep your hand elevated above the level of your heart.  Do not allow it to dangle by your side.  Keep the dressing dry and do not remove it unless your doctor advises you to do so.  He will usually change it at the time of your post-op visit.  Moving your fingers is advised to stimulate circulation but will depend on the site of your surgery.  If you have a splint applied, your doctor will advise you regarding movement.  Activity: Do not drive or operate machinery today.  Rest today and then you may return to your normal activity and work as indicated by your physician.  Diet:  Drink liquids today or eat a light diet.  You may resume a regular diet tomorrow.    General expectations: Pain for two to three days. Fingers may become slightly swollen.  Call your doctor if any of the following occur: Severe pain not relieved by pain medication. Elevated temperature. Dressing soaked with blood. Inability to move fingers. White or bluish color to fingers.   Post Anesthesia Home Care Instructions  Activity: Get plenty of rest for the remainder of the day. A responsible individual must stay with you for 24 hours following the procedure.  For the next 24 hours, DO NOT: -Drive a car -Paediatric nurse -Drink alcoholic beverages -Take any medication unless instructed by your physician -Make any legal decisions or sign important papers.  Meals: Start with liquid foods such as gelatin or soup. Progress to regular foods as tolerated. Avoid greasy, spicy, heavy foods. If nausea and/or vomiting occur, drink only clear liquids until the nausea and/or vomiting subsides. Call your physician if vomiting continues.  Special Instructions/Symptoms: Your throat may feel dry or sore from the anesthesia or the breathing tube placed in your throat during surgery. If this causes discomfort, gargle with warm salt water. The discomfort should disappear within  24 hours.  If you had a scopolamine patch placed behind your ear for the management of post- operative nausea and/or vomiting:  1. The medication in the patch is effective for 72 hours, after which it should be removed.  Wrap patch in a tissue and discard in the trash. Wash hands thoroughly with soap and water. 2. You may remove the patch earlier than 72 hours if you experience unpleasant side effects which may include dry mouth, dizziness or visual disturbances. 3. Avoid touching the patch. Wash your hands with soap and water after contact with the patch.    Regional Anesthesia Blocks  1. Numbness or the inability to move the "blocked" extremity may last from 3-48 hours after placement. The length of time depends on the medication injected and your individual response to the medication. If the numbness is not going away after 48 hours, call your surgeon.  2. The extremity that is blocked will need to be protected until the numbness is gone and the  Strength has returned. Because you cannot feel it, you will need to take extra care to avoid injury. Because it may be weak, you may have difficulty moving it or using it. You may not know what position it is in without looking at it while the block is in effect.  3. For blocks in the legs and feet, returning to weight bearing and walking needs to be done carefully. You will need to wait until the numbness is entirely gone and the strength has returned. You should be able to move your leg  and foot normally before you try and bear weight or walk. You will need someone to be with you when you first try to ensure you do not fall and possibly risk injury.  4. Bruising and tenderness at the needle site are common side effects and will resolve in a few days.  5. Persistent numbness or new problems with movement should be communicated to the surgeon or the [place] [phone] [place] [phone]).  *May take Tylenol  after 3pm today 06/19/20

## 2020-06-19 NOTE — H&P (Signed)
Donna Garrison is an 55 y.o. female.   Chief Complaint: numbness left hand HPI: Donna Garrison is a 55 year old female who comes in with complaint of numbness and tingling left arm. She is a former patient of Dr. Leanora Cover who undertook a ulnar collateral ligament repair in 2019. He has been seen by Dr. Milly Jakob she is seen by Dr. Grandville Silos on 11-21 she was complaining of numbness and tingling in the ring and small fingers which have been going on for considerable period of time she had nerve conductions done in the office which revealed no response to the left ulnar sensory with a positive Tinel's at the elbow. She does show some mild atrophy with changes at the elbow with decreased amplitude and slowed conduction velocity. Dr. Grandville Silos had discussed the possibility of a ulnar nerve decompression transposition with the possibility of an AIN transfer. She feels that the problem began after her surgery for her thumb and she was holding her elbow up on a constant basis in a flexed position. She is concerned about some slight atrophy to the intrinsics of her first webspace. She has no prior history of injury. She has no history of diabetes thyroid problems arthritis or gout. Family history is positive for diabetes. She has been tested. She complains of relatively constant numbness and tingling ring and small finger.     Past Medical History:  Diagnosis Date  . Breast cancer, right breast ([facility]) 03/22/2011  . Chronic back pain   . Depression   . GERD (gastroesophageal reflux disease)   . Rupture of UCL of left thumb     Past Surgical History:  Procedure Laterality Date  . ABDOMINAL HYSTERECTOMY    . BACK SURGERY     Lumbar Laminectomy L5-S1 2008  . BREAST SURGERY Right    lumpectomy  . TUBAL LIGATION    . ULNAR COLLATERAL LIGAMENT REPAIR Left 07/07/2017   Procedure: LEFT THUMB ULNAR COLLATERAL LIGAMENT REPAIR;  Surgeon: Leanora Cover, MD;  Location: [facility];  Service:  Orthopedics;  Laterality: Left;    Family History  Problem Relation Age of Onset  . Hypertension Mother   . Celiac disease Father   . Celiac disease Sister    Social History:  reports that she quit smoking about 20 years ago. She has never used smokeless tobacco. She reports current alcohol use. She reports current drug use. Drug: Marijuana.  Allergies:  Allergies  Allergen Reactions  . Penicillins Hives  . Sulfa Antibiotics Hives  . Letrozole Other (See Comments)    Muscle aches    No medications prior to admission.    Results for orders placed or performed during the hospital encounter of 06/17/20 (from the past 48 hour(s))  SARS CORONAVIRUS 2 (TAT 6-24 HRS) Nasopharyngeal Nasopharyngeal Swab     Status: None   Collection Time: 06/17/20 11:31 AM   Specimen: Nasopharyngeal Swab  Result Value Ref Range   SARS Coronavirus 2 NEGATIVE NEGATIVE    Comment: (NOTE) SARS-CoV-2 target nucleic acids are NOT DETECTED.  The SARS-CoV-2 RNA is generally detectable in upper and lower respiratory specimens during the acute phase of infection. Negative results do not preclude SARS-CoV-2 infection, do not rule out co-infections with other pathogens, and should not be used as the sole basis for treatment or other patient management decisions. Negative results must be combined with clinical observations, patient history, and epidemiological information. The expected result is Negative.  Fact Sheet for Patients: SugarRoll.be  Fact Sheet for Healthcare  Providers: https://www.woods-mathews.com/  This test is not yet approved or cleared by the [place] and  has been authorized for detection and/or diagnosis of SARS-CoV-2 by FDA under an Emergency Use Authorization (EUA). This EUA will remain  in effect (meaning this test can be used) for the duration of the COVID-19 declaration under Se ction 564(b)(1) of the Act, 21 U.S.C. section  360bbb-3(b)(1), unless the authorization is terminated or revoked sooner.  Performed at [facility], [place] [address] [place], [place] [address]     No results found.   Pertinent items are noted in HPI.  Height 5\' 6"  (1.676 m), weight 99.8 kg, last menstrual period 08/17/2012.  General appearance: alert, cooperative and appears stated age Head: Normocephalic, without obvious abnormality Neck: no JVD Resp: clear to auscultation bilaterally Cardio: regular rate and rhythm, S1, S2 normal, no murmur, click, rub or gallop GI: soft, non-tender; bowel sounds normal; no masses,  no organomegaly Extremities: numbness left hand Pulses: 2+ and symmetric Skin: Skin color, texture, turgor normal. No rashes or lesions Neurologic: Grossly normal Incision/Wound: na  Assessment/Plan Assessment:  1. Cubital tunnel syndrome on left    Plan: We have discussed possibility of surgically released to decompression possible transposition the ulnar nerve. We have discussed AIN transfers that there is incomplete studies as to the efficacy of these. Preperi-and postoperative course been discussed along with risk and complications. She would like to proceed to have this done. This to be scheduled in outpatient under regional anesthesia. She is advised that there is no guarantee to the surgery the possibility of infection recurrence injury to arteries nerves tendons complete relief symptoms dystrophy the fact that we are attempting to halt the process give the opportunity to get better but cannot guarantee that. She states that that is what she is hoping for. Her notes from Dr. Grandville Silos and nerve conductions are reviewed .     Daryll Brod 06/19/2020, 5:10 AM

## 2020-06-19 NOTE — Transfer of Care (Signed)
Immediate Anesthesia Transfer of Care Note  Patient: Donna Garrison  Procedure(s) Performed: DECOMPRESSION ULNAR NERVE LEFT ELBOW (Left Elbow)  Patient Location: PACU  Anesthesia Type:GA combined with regional for post-op pain  Level of Consciousness: awake, alert , oriented and patient cooperative  Airway & Oxygen Therapy: Patient Spontanous Breathing and Patient connected to nasal cannula oxygen  Post-op Assessment: Report given to RN, Post -op Vital signs reviewed and stable and Patient moving all extremities  Post vital signs: Reviewed and stable  Last Vitals:  Vitals Value Taken Time  BP 102/70 06/19/20 1052  Temp    Pulse 98 06/19/20 1054  Resp 16 06/19/20 1054  SpO2 99 % 06/19/20 1054  Vitals shown include unvalidated device data.  Last Pain:  Vitals:   06/19/20 0800  TempSrc: Oral  PainSc: 0-No pain         Complications: No complications documented.

## 2020-06-19 NOTE — Progress Notes (Signed)
Assisted Dr. Annye Asa with left, ultrasound guided, supraclavicular block. Side rails up, monitors on throughout procedure. See vital signs in flow sheet. Tolerated Procedure well.

## 2020-06-19 NOTE — Op Note (Signed)
NAME: Donna Garrison RECORD NO: [id number] DATE OF BIRTH: Oct 18, 1965 FACILITY: [facility] LOCATION: [facility] PHYSICIAN: Wynonia Sours, MD   OPERATIVE REPORT   DATE OF PROCEDURE: 06/19/20    PREOPERATIVE DIAGNOSIS:   Cubital tunnel syndrome left arm   POSTOPERATIVE DIAGNOSIS:   Same   PROCEDURE:   Decompression ulnar nerve left elbow   SURGEON: Daryll Brod, M.D.   ASSISTANT: Leanora Cover, MD   ANESTHESIA:  Regional with sedation   INTRAVENOUS FLUIDS:  Per anesthesia flow sheet.   ESTIMATED BLOOD LOSS:  Minimal.   COMPLICATIONS:  None.   SPECIMENS:  none   TOURNIQUET TIME:    Total Tourniquet Time Documented: Upper Arm (Left) - 18 minutes Total: Upper Arm (Left) - 18 minutes    DISPOSITION:  Stable to PACU.   INDICATIONS: Patient is a 55 year old female with a history of numbness and tingling in her left hand ring and small finger nerve conductions positive she has begun to show mild atrophy to the intrinsics of the hand.  She is elected undergo surgical decompression to the ulnar nerve.  Preperi-and postoperative course been discussed along with risks and complications.  She is aware that there is no guarantee to the surgery the possibility of infection recurrence injury to arteries nerves tendons complete relief symptoms and dystrophy.  Preoperative area the patient is seen the extremity marked by both patient and surgeon antibiotic given.  A supraclavicular block was carried out without difficulty under the direction of the anesthesia department.  OPERATIVE COURSE: Patient brought to the operating room placed in the supine position with left arm free.  3-minute dry time was allowed after a prep was done with ChloraPrep.  A timeout was taken to confirm patient procedure.  The limb was exsanguinated with an Esmarch bandage turn placed on the arm was inflated to 250 mmHg.  A longitudinal incision was made just posterior to the medial epicondyle left elbow  carried down through subcutaneous tissue.  Bleeders were electrocauterized with bipolar.  Posterior branches of the medial antebrachial cutaneous nerve of the forearm were identified protected.  The dissection carried down to Osborne's fascia which was released on its posterior aspect.  The ulnar nerve was identified.  The skin and forearm subcutaneous tissue was dissected from the flexor carpi ulnaris.  A knee retractor was placed.  The superficial portion of the flexor carpi ulnaris fascia was then released for approximately 8 cm distally.  The muscle belly was bluntly split.  A KM guide was then placed between the nerve and the deep forearm fascia and this was released for approximately 8 cm distally using the angled ENT scissors.  Attention was then directed proximally.  The brachial fascia was dissected free from the brachial subcutaneous tissue and skin.  Knee retractor was placed proximally.  The brachial fascia was then separated from the ulnar nerve using the K MI guide.  The fascia was then released for approximately 8 cm proximally under direct vision with the angled ENT scissors.  The elbow was fully flexed no subluxation was noted.  The wound was copiously irrigated with saline.  Osborne's fascia was then repaired to the posterior subcutaneous flap with figure-of-eight 2-0 Vicryl sutures and scope the deep subcutaneous tissue was closed with interrupted 2-0 Vicryl the subcutaneous tissue with interrupted 4-0 Vicryl and skin with interrupted 4 nylon sutures.  A sterile compressive dressing to the elbow was applied.  Deflation of the tourniquet all fingers immediately pink.  She was  taken to the recovery room for observation in satisfactory condition.  She will be discharged home to return to the hand center of [facility] in 1 week and Tylenol ibuprofen for pain with Ultram for breakthrough.   Daryll Brod, MD Electronically signed, 06/19/20

## 2020-06-19 NOTE — Anesthesia Procedure Notes (Signed)
Anesthesia Regional Block: Supraclavicular block   Pre-Anesthetic Checklist: ,, timeout performed, Correct Patient, Correct Site, Correct Laterality, Correct Procedure, Correct Position, site marked, Risks and benefits discussed,  Surgical consent,  Pre-op evaluation,  At surgeon's request and post-op pain management  Laterality: Left and Upper  Prep: chloraprep       Needles:  Injection technique: Single-shot  Needle Type: Echogenic Needle     Needle Length: 9cm  Needle Gauge: 21     Additional Needles:   Procedures:,,,, ultrasound used (permanent image in chart),,,,  Narrative:  Start time: 06/19/2020 9:10 AM End time: 06/19/2020 9:16 AM Injection made incrementally with aspirations every 5 mL.  Performed by: Personally  Anesthesiologist: Annye Asa, MD  Additional Notes: Pt identified in Holding room.  Monitors applied. Working IV access confirmed. Sterile prep L clavicle and neck.  #21ga ECHOgenic Arrow block needle to supraclavicular brachial plexus with US guidance.  30cc 0.5% Bupivacaine with 1:200k epi injected incrementally after negative test dose.  Patient asymptomatic, VSS, no heme aspirated, tolerated well.  Jenita Seashore, MD

## 2020-06-19 NOTE — Brief Op Note (Signed)
06/19/2020  10:47 AM  PATIENT:  Donna Garrison  55 y.o. female  PRE-OPERATIVE DIAGNOSIS:  LEFT ELBOW CUBITAL TUNNEL SYNDROME  POST-OPERATIVE DIAGNOSIS:  LEFT ELBOW CUBITAL TUNNEL SYNDROME  PROCEDURE:  Procedure(s): DECOMPRESSION ULNAR NERVE LEFT ELBOW (Left)  SURGEON:  Surgeon(s) and Role:    * Daryll Brod, MD - Primary    * Leanora Cover, MD  PHYSICIAN ASSISTANT:   ASSISTANTS: Richardo Priest, MD   ANESTHESIA:   regional and IV sedation  EBL:  1 mL   BLOOD ADMINISTERED:none  DRAINS: none   LOCAL MEDICATIONS USED:  NONE  SPECIMEN:  No Specimen  DISPOSITION OF SPECIMEN:  N/A  COUNTS:  YES  TOURNIQUET:   Total Tourniquet Time Documented: Upper Arm (Left) - 18 minutes Total: Upper Arm (Left) - 18 minutes   DICTATION: .Viviann Spare Dictation  PLAN OF CARE: Discharge to home after PACU  PATIENT DISPOSITION:  PACU - hemodynamically stable.

## 2020-06-20 ENCOUNTER — Encounter (HOSPITAL_BASED_OUTPATIENT_CLINIC_OR_DEPARTMENT_OTHER): Payer: Self-pay | Admitting: Orthopedic Surgery

## 2020-08-25 DIAGNOSIS — Z0000 Encounter for general adult medical examination without abnormal findings: Secondary | ICD-10-CM | POA: Diagnosis not present

## 2020-08-25 DIAGNOSIS — Z23 Encounter for immunization: Secondary | ICD-10-CM | POA: Diagnosis not present

## 2020-08-25 DIAGNOSIS — F419 Anxiety disorder, unspecified: Secondary | ICD-10-CM | POA: Diagnosis not present

## 2020-08-25 DIAGNOSIS — E782 Mixed hyperlipidemia: Secondary | ICD-10-CM | POA: Diagnosis not present

## 2020-08-25 DIAGNOSIS — Z853 Personal history of malignant neoplasm of breast: Secondary | ICD-10-CM | POA: Diagnosis not present

## 2020-08-25 DIAGNOSIS — J302 Other seasonal allergic rhinitis: Secondary | ICD-10-CM | POA: Diagnosis not present

## 2020-11-22 DIAGNOSIS — J069 Acute upper respiratory infection, unspecified: Secondary | ICD-10-CM | POA: Diagnosis not present

## 2020-11-22 DIAGNOSIS — R509 Fever, unspecified: Secondary | ICD-10-CM | POA: Diagnosis not present

## 2020-11-22 DIAGNOSIS — U071 COVID-19: Secondary | ICD-10-CM | POA: Diagnosis not present

## 2020-11-22 DIAGNOSIS — R051 Acute cough: Secondary | ICD-10-CM | POA: Diagnosis not present

## 2020-12-11 DIAGNOSIS — Z90710 Acquired absence of both cervix and uterus: Secondary | ICD-10-CM | POA: Diagnosis not present

## 2020-12-11 DIAGNOSIS — Z1231 Encounter for screening mammogram for malignant neoplasm of breast: Secondary | ICD-10-CM | POA: Diagnosis not present

## 2020-12-11 DIAGNOSIS — Z90722 Acquired absence of ovaries, bilateral: Secondary | ICD-10-CM | POA: Diagnosis not present

## 2020-12-11 DIAGNOSIS — Z08 Encounter for follow-up examination after completed treatment for malignant neoplasm: Secondary | ICD-10-CM | POA: Diagnosis not present

## 2020-12-11 DIAGNOSIS — Z8616 Personal history of COVID-19: Secondary | ICD-10-CM | POA: Diagnosis not present

## 2020-12-11 DIAGNOSIS — Z28310 Unvaccinated for covid-19: Secondary | ICD-10-CM | POA: Diagnosis not present

## 2020-12-11 DIAGNOSIS — R921 Mammographic calcification found on diagnostic imaging of breast: Secondary | ICD-10-CM | POA: Diagnosis not present

## 2020-12-11 DIAGNOSIS — Z79899 Other long term (current) drug therapy: Secondary | ICD-10-CM | POA: Diagnosis not present

## 2020-12-11 DIAGNOSIS — Z9889 Other specified postprocedural states: Secondary | ICD-10-CM | POA: Diagnosis not present

## 2020-12-11 DIAGNOSIS — R928 Other abnormal and inconclusive findings on diagnostic imaging of breast: Secondary | ICD-10-CM | POA: Diagnosis not present

## 2020-12-11 DIAGNOSIS — Z853 Personal history of malignant neoplasm of breast: Secondary | ICD-10-CM | POA: Diagnosis not present

## 2020-12-11 DIAGNOSIS — Z923 Personal history of irradiation: Secondary | ICD-10-CM | POA: Diagnosis not present

## 2020-12-11 DIAGNOSIS — C50911 Malignant neoplasm of unspecified site of right female breast: Secondary | ICD-10-CM | POA: Diagnosis not present

## 2020-12-11 DIAGNOSIS — K769 Liver disease, unspecified: Secondary | ICD-10-CM | POA: Diagnosis not present

## 2020-12-11 DIAGNOSIS — Z2821 Immunization not carried out because of patient refusal: Secondary | ICD-10-CM | POA: Diagnosis not present

## 2020-12-19 DIAGNOSIS — N6489 Other specified disorders of breast: Secondary | ICD-10-CM | POA: Diagnosis not present

## 2020-12-19 DIAGNOSIS — D0501 Lobular carcinoma in situ of right breast: Secondary | ICD-10-CM | POA: Diagnosis not present

## 2020-12-19 DIAGNOSIS — D0511 Intraductal carcinoma in situ of right breast: Secondary | ICD-10-CM | POA: Diagnosis not present

## 2021-01-09 DIAGNOSIS — C50911 Malignant neoplasm of unspecified site of right female breast: Secondary | ICD-10-CM | POA: Diagnosis not present

## 2021-01-09 DIAGNOSIS — Z853 Personal history of malignant neoplasm of breast: Secondary | ICD-10-CM | POA: Diagnosis not present

## 2021-01-09 DIAGNOSIS — D0511 Intraductal carcinoma in situ of right breast: Secondary | ICD-10-CM | POA: Diagnosis not present

## 2021-01-26 DIAGNOSIS — R920 Mammographic microcalcification found on diagnostic imaging of breast: Secondary | ICD-10-CM | POA: Diagnosis not present

## 2021-01-26 DIAGNOSIS — Z421 Encounter for breast reconstruction following mastectomy: Secondary | ICD-10-CM | POA: Diagnosis not present

## 2021-01-26 DIAGNOSIS — D0511 Intraductal carcinoma in situ of right breast: Secondary | ICD-10-CM | POA: Diagnosis not present

## 2021-01-26 DIAGNOSIS — Z170 Estrogen receptor positive status [ER+]: Secondary | ICD-10-CM | POA: Diagnosis not present

## 2021-01-26 DIAGNOSIS — N62 Hypertrophy of breast: Secondary | ICD-10-CM | POA: Diagnosis not present

## 2021-01-27 DIAGNOSIS — C50911 Malignant neoplasm of unspecified site of right female breast: Secondary | ICD-10-CM | POA: Diagnosis not present

## 2021-01-27 DIAGNOSIS — D0511 Intraductal carcinoma in situ of right breast: Secondary | ICD-10-CM | POA: Diagnosis not present

## 2021-02-16 DIAGNOSIS — Z85828 Personal history of other malignant neoplasm of skin: Secondary | ICD-10-CM | POA: Diagnosis not present

## 2021-02-16 DIAGNOSIS — T8140XA Infection following a procedure, unspecified, initial encounter: Secondary | ICD-10-CM | POA: Diagnosis not present

## 2021-02-16 DIAGNOSIS — L02411 Cutaneous abscess of right axilla: Secondary | ICD-10-CM | POA: Diagnosis not present

## 2021-02-16 DIAGNOSIS — N644 Mastodynia: Secondary | ICD-10-CM | POA: Diagnosis not present

## 2021-02-16 DIAGNOSIS — R0602 Shortness of breath: Secondary | ICD-10-CM | POA: Diagnosis not present

## 2021-02-16 DIAGNOSIS — Z90710 Acquired absence of both cervix and uterus: Secondary | ICD-10-CM | POA: Diagnosis not present

## 2021-02-16 DIAGNOSIS — T888XXA Other specified complications of surgical and medical care, not elsewhere classified, initial encounter: Secondary | ICD-10-CM | POA: Diagnosis not present

## 2021-02-16 DIAGNOSIS — K219 Gastro-esophageal reflux disease without esophagitis: Secondary | ICD-10-CM | POA: Diagnosis not present

## 2021-02-16 DIAGNOSIS — Z87891 Personal history of nicotine dependence: Secondary | ICD-10-CM | POA: Diagnosis not present

## 2021-02-16 DIAGNOSIS — E669 Obesity, unspecified: Secondary | ICD-10-CM | POA: Diagnosis not present

## 2021-02-16 DIAGNOSIS — Z6835 Body mass index (BMI) 35.0-35.9, adult: Secondary | ICD-10-CM | POA: Diagnosis not present

## 2021-02-16 DIAGNOSIS — D0511 Intraductal carcinoma in situ of right breast: Secondary | ICD-10-CM | POA: Diagnosis not present

## 2021-02-16 DIAGNOSIS — Z853 Personal history of malignant neoplasm of breast: Secondary | ICD-10-CM | POA: Diagnosis not present

## 2021-02-16 DIAGNOSIS — R509 Fever, unspecified: Secondary | ICD-10-CM | POA: Diagnosis not present

## 2021-02-16 DIAGNOSIS — Z87442 Personal history of urinary calculi: Secondary | ICD-10-CM | POA: Diagnosis not present

## 2021-02-16 DIAGNOSIS — Z20822 Contact with and (suspected) exposure to covid-19: Secondary | ICD-10-CM | POA: Diagnosis not present

## 2021-02-16 DIAGNOSIS — N6489 Other specified disorders of breast: Secondary | ICD-10-CM | POA: Diagnosis not present

## 2021-02-16 DIAGNOSIS — B952 Enterococcus as the cause of diseases classified elsewhere: Secondary | ICD-10-CM | POA: Diagnosis not present

## 2021-02-16 DIAGNOSIS — Z9011 Acquired absence of right breast and nipple: Secondary | ICD-10-CM | POA: Diagnosis not present

## 2021-02-16 DIAGNOSIS — N641 Fat necrosis of breast: Secondary | ICD-10-CM | POA: Diagnosis not present

## 2021-02-16 DIAGNOSIS — F32A Depression, unspecified: Secondary | ICD-10-CM | POA: Diagnosis not present

## 2021-02-16 DIAGNOSIS — Z880 Allergy status to penicillin: Secondary | ICD-10-CM | POA: Diagnosis not present

## 2021-02-16 DIAGNOSIS — J329 Chronic sinusitis, unspecified: Secondary | ICD-10-CM | POA: Diagnosis not present

## 2021-02-16 DIAGNOSIS — A419 Sepsis, unspecified organism: Secondary | ICD-10-CM | POA: Diagnosis not present

## 2021-02-16 DIAGNOSIS — R519 Headache, unspecified: Secondary | ICD-10-CM | POA: Diagnosis not present

## 2021-02-16 DIAGNOSIS — M96843 Postprocedural seroma of a musculoskeletal structure following other procedure: Secondary | ICD-10-CM | POA: Diagnosis not present

## 2021-02-16 DIAGNOSIS — T8579XA Infection and inflammatory reaction due to other internal prosthetic devices, implants and grafts, initial encounter: Secondary | ICD-10-CM | POA: Diagnosis not present

## 2021-03-09 DIAGNOSIS — N644 Mastodynia: Secondary | ICD-10-CM | POA: Diagnosis not present

## 2021-03-11 DIAGNOSIS — N6489 Other specified disorders of breast: Secondary | ICD-10-CM | POA: Diagnosis not present

## 2021-03-18 DIAGNOSIS — N644 Mastodynia: Secondary | ICD-10-CM | POA: Diagnosis not present

## 2021-03-18 DIAGNOSIS — N6489 Other specified disorders of breast: Secondary | ICD-10-CM | POA: Diagnosis not present

## 2021-04-01 DIAGNOSIS — Z4682 Encounter for fitting and adjustment of non-vascular catheter: Secondary | ICD-10-CM | POA: Diagnosis not present

## 2021-04-01 DIAGNOSIS — C50911 Malignant neoplasm of unspecified site of right female breast: Secondary | ICD-10-CM | POA: Diagnosis not present

## 2021-04-01 DIAGNOSIS — Z170 Estrogen receptor positive status [ER+]: Secondary | ICD-10-CM | POA: Diagnosis not present

## 2021-04-22 DIAGNOSIS — T888XXD Other specified complications of surgical and medical care, not elsewhere classified, subsequent encounter: Secondary | ICD-10-CM | POA: Diagnosis not present

## 2021-04-22 DIAGNOSIS — Z4803 Encounter for change or removal of drains: Secondary | ICD-10-CM | POA: Diagnosis not present

## 2021-04-22 DIAGNOSIS — N6489 Other specified disorders of breast: Secondary | ICD-10-CM | POA: Diagnosis not present

## 2021-05-12 DIAGNOSIS — C50911 Malignant neoplasm of unspecified site of right female breast: Secondary | ICD-10-CM | POA: Diagnosis not present

## 2021-05-13 DIAGNOSIS — C50911 Malignant neoplasm of unspecified site of right female breast: Secondary | ICD-10-CM | POA: Diagnosis not present

## 2021-05-13 DIAGNOSIS — Z4889 Encounter for other specified surgical aftercare: Secondary | ICD-10-CM | POA: Diagnosis not present

## 2021-05-20 DIAGNOSIS — Z4889 Encounter for other specified surgical aftercare: Secondary | ICD-10-CM | POA: Diagnosis not present

## 2021-05-27 DIAGNOSIS — D0511 Intraductal carcinoma in situ of right breast: Secondary | ICD-10-CM | POA: Diagnosis not present

## 2021-05-27 DIAGNOSIS — Z4889 Encounter for other specified surgical aftercare: Secondary | ICD-10-CM | POA: Diagnosis not present

## 2021-05-27 DIAGNOSIS — T888XXS Other specified complications of surgical and medical care, not elsewhere classified, sequela: Secondary | ICD-10-CM | POA: Diagnosis not present

## 2021-05-27 DIAGNOSIS — Z9011 Acquired absence of right breast and nipple: Secondary | ICD-10-CM | POA: Diagnosis not present

## 2021-05-28 DIAGNOSIS — Z923 Personal history of irradiation: Secondary | ICD-10-CM | POA: Diagnosis not present

## 2021-05-28 DIAGNOSIS — Z8619 Personal history of other infectious and parasitic diseases: Secondary | ICD-10-CM | POA: Diagnosis not present

## 2021-05-28 DIAGNOSIS — Z9011 Acquired absence of right breast and nipple: Secondary | ICD-10-CM | POA: Diagnosis not present

## 2021-05-28 DIAGNOSIS — Y838 Other surgical procedures as the cause of abnormal reaction of the patient, or of later complication, without mention of misadventure at the time of the procedure: Secondary | ICD-10-CM | POA: Diagnosis not present

## 2021-05-28 DIAGNOSIS — R87619 Unspecified abnormal cytological findings in specimens from cervix uteri: Secondary | ICD-10-CM | POA: Diagnosis not present

## 2021-05-28 DIAGNOSIS — Z85828 Personal history of other malignant neoplasm of skin: Secondary | ICD-10-CM | POA: Diagnosis not present

## 2021-05-28 DIAGNOSIS — Z853 Personal history of malignant neoplasm of breast: Secondary | ICD-10-CM | POA: Diagnosis not present

## 2021-05-28 DIAGNOSIS — N6489 Other specified disorders of breast: Secondary | ICD-10-CM | POA: Diagnosis not present

## 2021-05-28 DIAGNOSIS — Z87442 Personal history of urinary calculi: Secondary | ICD-10-CM | POA: Diagnosis not present

## 2021-05-28 DIAGNOSIS — L7634 Postprocedural seroma of skin and subcutaneous tissue following other procedure: Secondary | ICD-10-CM | POA: Diagnosis not present

## 2021-05-28 DIAGNOSIS — F32A Depression, unspecified: Secondary | ICD-10-CM | POA: Diagnosis not present

## 2021-05-28 DIAGNOSIS — Z87891 Personal history of nicotine dependence: Secondary | ICD-10-CM | POA: Diagnosis not present

## 2021-05-28 DIAGNOSIS — T8141XA Infection following a procedure, superficial incisional surgical site, initial encounter: Secondary | ICD-10-CM | POA: Diagnosis not present

## 2021-07-28 DIAGNOSIS — E782 Mixed hyperlipidemia: Secondary | ICD-10-CM | POA: Diagnosis not present

## 2021-07-28 DIAGNOSIS — E669 Obesity, unspecified: Secondary | ICD-10-CM | POA: Diagnosis not present

## 2021-09-07 DIAGNOSIS — Z0000 Encounter for general adult medical examination without abnormal findings: Secondary | ICD-10-CM | POA: Diagnosis not present

## 2021-09-07 DIAGNOSIS — Z23 Encounter for immunization: Secondary | ICD-10-CM | POA: Diagnosis not present

## 2021-09-07 DIAGNOSIS — Z124 Encounter for screening for malignant neoplasm of cervix: Secondary | ICD-10-CM | POA: Diagnosis not present

## 2021-09-07 DIAGNOSIS — Z1231 Encounter for screening mammogram for malignant neoplasm of breast: Secondary | ICD-10-CM | POA: Diagnosis not present

## 2021-10-07 DIAGNOSIS — C50911 Malignant neoplasm of unspecified site of right female breast: Secondary | ICD-10-CM | POA: Diagnosis not present

## 2021-12-10 DIAGNOSIS — N952 Postmenopausal atrophic vaginitis: Secondary | ICD-10-CM | POA: Diagnosis not present

## 2021-12-10 DIAGNOSIS — Z9889 Other specified postprocedural states: Secondary | ICD-10-CM | POA: Diagnosis not present

## 2021-12-10 DIAGNOSIS — K7689 Other specified diseases of liver: Secondary | ICD-10-CM | POA: Diagnosis not present

## 2021-12-10 DIAGNOSIS — Z853 Personal history of malignant neoplasm of breast: Secondary | ICD-10-CM | POA: Diagnosis not present

## 2021-12-10 DIAGNOSIS — Z1231 Encounter for screening mammogram for malignant neoplasm of breast: Secondary | ICD-10-CM | POA: Diagnosis not present

## 2021-12-10 DIAGNOSIS — Z08 Encounter for follow-up examination after completed treatment for malignant neoplasm: Secondary | ICD-10-CM | POA: Diagnosis not present

## 2021-12-10 DIAGNOSIS — Z9011 Acquired absence of right breast and nipple: Secondary | ICD-10-CM | POA: Diagnosis not present

## 2021-12-10 DIAGNOSIS — D0511 Intraductal carcinoma in situ of right breast: Secondary | ICD-10-CM | POA: Diagnosis not present

## 2022-01-05 DIAGNOSIS — Z23 Encounter for immunization: Secondary | ICD-10-CM | POA: Diagnosis not present

## 2022-01-05 DIAGNOSIS — E782 Mixed hyperlipidemia: Secondary | ICD-10-CM | POA: Diagnosis not present

## 2022-01-05 DIAGNOSIS — F419 Anxiety disorder, unspecified: Secondary | ICD-10-CM | POA: Diagnosis not present

## 2022-01-05 DIAGNOSIS — E8941 Symptomatic postprocedural ovarian failure: Secondary | ICD-10-CM | POA: Diagnosis not present

## 2022-01-05 DIAGNOSIS — E669 Obesity, unspecified: Secondary | ICD-10-CM | POA: Diagnosis not present

## 2022-02-04 DIAGNOSIS — D485 Neoplasm of uncertain behavior of skin: Secondary | ICD-10-CM | POA: Diagnosis not present

## 2022-02-04 DIAGNOSIS — D239 Other benign neoplasm of skin, unspecified: Secondary | ICD-10-CM | POA: Diagnosis not present

## 2022-02-04 DIAGNOSIS — D2271 Melanocytic nevi of right lower limb, including hip: Secondary | ICD-10-CM | POA: Diagnosis not present

## 2022-02-04 DIAGNOSIS — L821 Other seborrheic keratosis: Secondary | ICD-10-CM | POA: Diagnosis not present

## 2022-02-04 DIAGNOSIS — L7211 Pilar cyst: Secondary | ICD-10-CM | POA: Diagnosis not present

## 2022-02-18 DIAGNOSIS — L7211 Pilar cyst: Secondary | ICD-10-CM | POA: Diagnosis not present

## 2022-03-01 DIAGNOSIS — L7211 Pilar cyst: Secondary | ICD-10-CM | POA: Diagnosis not present

## 2022-03-15 DIAGNOSIS — L7211 Pilar cyst: Secondary | ICD-10-CM | POA: Diagnosis not present

## 2022-07-08 DIAGNOSIS — E669 Obesity, unspecified: Secondary | ICD-10-CM | POA: Diagnosis not present

## 2022-07-08 DIAGNOSIS — E8941 Symptomatic postprocedural ovarian failure: Secondary | ICD-10-CM | POA: Diagnosis not present

## 2022-07-08 DIAGNOSIS — F419 Anxiety disorder, unspecified: Secondary | ICD-10-CM | POA: Diagnosis not present

## 2022-07-08 DIAGNOSIS — E782 Mixed hyperlipidemia: Secondary | ICD-10-CM | POA: Diagnosis not present

## 2022-07-13 DIAGNOSIS — Z880 Allergy status to penicillin: Secondary | ICD-10-CM | POA: Diagnosis not present

## 2022-07-13 DIAGNOSIS — N6331 Unspecified lump in axillary tail of the right breast: Secondary | ICD-10-CM | POA: Diagnosis not present

## 2022-07-13 DIAGNOSIS — Z888 Allergy status to other drugs, medicaments and biological substances status: Secondary | ICD-10-CM | POA: Diagnosis not present

## 2022-07-13 DIAGNOSIS — R2231 Localized swelling, mass and lump, right upper limb: Secondary | ICD-10-CM | POA: Diagnosis not present

## 2022-07-13 DIAGNOSIS — R232 Flushing: Secondary | ICD-10-CM | POA: Diagnosis not present

## 2022-07-13 DIAGNOSIS — Z882 Allergy status to sulfonamides status: Secondary | ICD-10-CM | POA: Diagnosis not present

## 2022-07-13 DIAGNOSIS — Z9011 Acquired absence of right breast and nipple: Secondary | ICD-10-CM | POA: Diagnosis not present

## 2022-07-13 DIAGNOSIS — N951 Menopausal and female climacteric states: Secondary | ICD-10-CM | POA: Diagnosis not present

## 2022-07-13 DIAGNOSIS — Z853 Personal history of malignant neoplasm of breast: Secondary | ICD-10-CM | POA: Diagnosis not present

## 2022-07-13 DIAGNOSIS — Z923 Personal history of irradiation: Secondary | ICD-10-CM | POA: Diagnosis not present

## 2022-07-13 DIAGNOSIS — Z08 Encounter for follow-up examination after completed treatment for malignant neoplasm: Secondary | ICD-10-CM | POA: Diagnosis not present

## 2022-07-16 DIAGNOSIS — R2231 Localized swelling, mass and lump, right upper limb: Secondary | ICD-10-CM | POA: Diagnosis not present

## 2022-07-16 DIAGNOSIS — N641 Fat necrosis of breast: Secondary | ICD-10-CM | POA: Diagnosis not present

## 2022-07-16 DIAGNOSIS — R928 Other abnormal and inconclusive findings on diagnostic imaging of breast: Secondary | ICD-10-CM | POA: Diagnosis not present

## 2022-12-30 DIAGNOSIS — Z8059 Family history of malignant neoplasm of other urinary tract organ: Secondary | ICD-10-CM | POA: Diagnosis not present

## 2022-12-30 DIAGNOSIS — Z923 Personal history of irradiation: Secondary | ICD-10-CM | POA: Diagnosis not present

## 2022-12-30 DIAGNOSIS — R928 Other abnormal and inconclusive findings on diagnostic imaging of breast: Secondary | ICD-10-CM | POA: Diagnosis not present

## 2022-12-30 DIAGNOSIS — Z803 Family history of malignant neoplasm of breast: Secondary | ICD-10-CM | POA: Diagnosis not present

## 2022-12-30 DIAGNOSIS — Z853 Personal history of malignant neoplasm of breast: Secondary | ICD-10-CM | POA: Diagnosis not present

## 2022-12-30 DIAGNOSIS — Z08 Encounter for follow-up examination after completed treatment for malignant neoplasm: Secondary | ICD-10-CM | POA: Diagnosis not present

## 2022-12-30 DIAGNOSIS — Z808 Family history of malignant neoplasm of other organs or systems: Secondary | ICD-10-CM | POA: Diagnosis not present

## 2022-12-30 DIAGNOSIS — Z1231 Encounter for screening mammogram for malignant neoplasm of breast: Secondary | ICD-10-CM | POA: Diagnosis not present

## 2022-12-30 DIAGNOSIS — D0511 Intraductal carcinoma in situ of right breast: Secondary | ICD-10-CM | POA: Diagnosis not present

## 2022-12-30 DIAGNOSIS — Z9011 Acquired absence of right breast and nipple: Secondary | ICD-10-CM | POA: Diagnosis not present

## 2023-01-06 DIAGNOSIS — E8941 Symptomatic postprocedural ovarian failure: Secondary | ICD-10-CM | POA: Diagnosis not present

## 2023-01-06 DIAGNOSIS — Z0000 Encounter for general adult medical examination without abnormal findings: Secondary | ICD-10-CM | POA: Diagnosis not present

## 2023-01-06 DIAGNOSIS — E669 Obesity, unspecified: Secondary | ICD-10-CM | POA: Diagnosis not present

## 2023-01-06 DIAGNOSIS — F419 Anxiety disorder, unspecified: Secondary | ICD-10-CM | POA: Diagnosis not present

## 2023-01-06 DIAGNOSIS — F411 Generalized anxiety disorder: Secondary | ICD-10-CM | POA: Diagnosis not present

## 2023-01-06 DIAGNOSIS — E782 Mixed hyperlipidemia: Secondary | ICD-10-CM | POA: Diagnosis not present

## 2023-02-18 DIAGNOSIS — M25512 Pain in left shoulder: Secondary | ICD-10-CM | POA: Diagnosis not present

## 2023-03-07 ENCOUNTER — Other Ambulatory Visit (HOSPITAL_COMMUNITY): Payer: Self-pay | Admitting: Otolaryngology

## 2023-03-07 DIAGNOSIS — M25512 Pain in left shoulder: Secondary | ICD-10-CM | POA: Diagnosis not present

## 2023-03-10 ENCOUNTER — Telehealth: Payer: Self-pay | Admitting: Orthopedic Surgery

## 2023-03-10 NOTE — Telephone Encounter (Signed)
Patient called wanting to be seen with [place] because she has been to [facility] and has seen the PA and they gave her a shot and they scheduled her a MRI and it isn't till next week.  She wants to be seen ASAP.  I advised her no we will need the results of the MRI and the office notes and Xray from  [facility] and our doctors will have to review it this is considered a 2nd opinion she said she only saw the PA not the doctor, then she hung up.

## 2023-03-15 DIAGNOSIS — M25512 Pain in left shoulder: Secondary | ICD-10-CM | POA: Diagnosis not present

## 2023-03-16 ENCOUNTER — Encounter (HOSPITAL_COMMUNITY): Payer: Self-pay

## 2023-03-16 ENCOUNTER — Ambulatory Visit (HOSPITAL_COMMUNITY): Payer: BC Managed Care – PPO

## 2023-03-22 DIAGNOSIS — M75112 Incomplete rotator cuff tear or rupture of left shoulder, not specified as traumatic: Secondary | ICD-10-CM | POA: Diagnosis not present

## 2023-04-04 DIAGNOSIS — M7502 Adhesive capsulitis of left shoulder: Secondary | ICD-10-CM | POA: Diagnosis not present

## 2023-04-12 DIAGNOSIS — M25612 Stiffness of left shoulder, not elsewhere classified: Secondary | ICD-10-CM | POA: Diagnosis not present

## 2023-04-12 DIAGNOSIS — M25512 Pain in left shoulder: Secondary | ICD-10-CM | POA: Diagnosis not present

## 2023-07-11 DIAGNOSIS — F419 Anxiety disorder, unspecified: Secondary | ICD-10-CM | POA: Diagnosis not present

## 2023-07-11 DIAGNOSIS — E8941 Symptomatic postprocedural ovarian failure: Secondary | ICD-10-CM | POA: Diagnosis not present

## 2023-07-11 DIAGNOSIS — E669 Obesity, unspecified: Secondary | ICD-10-CM | POA: Diagnosis not present

## 2023-09-21 ENCOUNTER — Telehealth: Payer: Self-pay | Admitting: General Practice

## 2023-09-21 NOTE — Telephone Encounter (Signed)
 Tried calling patient to answer questions she has regarding our practice and providers. Currently, we are booking out until July 7th for new patient appointments, and that appointment is with Karon Packer. She has been with our practice for almost a year. As far as turn over rate, most of our providers have been here for 5+ years. We have had a few providers to leave within that time frame, but not because they were unhappy at [facility], but because of new opportunities that they felt led to do.   Copied from CRM [phone]. Topic: General - Other >> Sep 21, 2023 10:20 AM Crispin Dolphin wrote: Reason for CRM: Patient called. Trying to find new PCP. Had questions about the providers in clinic. Wanted to know if they prescribed weight loss medication and also what the turn over rate is. One problem with her current practice is that they keep changing providers. Thank You

## 2023-09-23 DIAGNOSIS — Z6828 Body mass index (BMI) 28.0-28.9, adult: Secondary | ICD-10-CM | POA: Diagnosis not present

## 2023-09-23 DIAGNOSIS — E663 Overweight: Secondary | ICD-10-CM | POA: Diagnosis not present

## 2023-09-23 DIAGNOSIS — I950 Idiopathic hypotension: Secondary | ICD-10-CM | POA: Diagnosis not present

## 2023-11-01 ENCOUNTER — Encounter: Payer: Self-pay | Admitting: *Deleted

## 2023-11-21 ENCOUNTER — Ambulatory Visit: Payer: Self-pay | Admitting: Nurse Practitioner

## 2023-11-22 ENCOUNTER — Ambulatory Visit: Payer: Self-pay | Admitting: Nurse Practitioner

## 2024-01-05 DIAGNOSIS — Z9011 Acquired absence of right breast and nipple: Secondary | ICD-10-CM | POA: Diagnosis not present

## 2024-01-05 DIAGNOSIS — Z87891 Personal history of nicotine dependence: Secondary | ICD-10-CM | POA: Diagnosis not present

## 2024-01-05 DIAGNOSIS — R92322 Mammographic fibroglandular density, left breast: Secondary | ICD-10-CM | POA: Diagnosis not present

## 2024-01-05 DIAGNOSIS — Z1231 Encounter for screening mammogram for malignant neoplasm of breast: Secondary | ICD-10-CM | POA: Diagnosis not present

## 2024-01-05 DIAGNOSIS — R238 Other skin changes: Secondary | ICD-10-CM | POA: Diagnosis not present

## 2024-01-05 DIAGNOSIS — Z170 Estrogen receptor positive status [ER+]: Secondary | ICD-10-CM | POA: Diagnosis not present

## 2024-01-05 DIAGNOSIS — Z79899 Other long term (current) drug therapy: Secondary | ICD-10-CM | POA: Diagnosis not present

## 2024-01-05 DIAGNOSIS — Z90710 Acquired absence of both cervix and uterus: Secondary | ICD-10-CM | POA: Diagnosis not present

## 2024-01-05 DIAGNOSIS — C50911 Malignant neoplasm of unspecified site of right female breast: Secondary | ICD-10-CM | POA: Diagnosis not present

## 2024-01-05 DIAGNOSIS — Z86000 Personal history of in-situ neoplasm of breast: Secondary | ICD-10-CM | POA: Diagnosis not present

## 2024-01-05 DIAGNOSIS — R232 Flushing: Secondary | ICD-10-CM | POA: Diagnosis not present

## 2024-01-05 DIAGNOSIS — Z08 Encounter for follow-up examination after completed treatment for malignant neoplasm: Secondary | ICD-10-CM | POA: Diagnosis not present

## 2024-01-05 DIAGNOSIS — Z923 Personal history of irradiation: Secondary | ICD-10-CM | POA: Diagnosis not present

## 2024-01-05 DIAGNOSIS — N952 Postmenopausal atrophic vaginitis: Secondary | ICD-10-CM | POA: Diagnosis not present

## 2024-01-05 DIAGNOSIS — Z9221 Personal history of antineoplastic chemotherapy: Secondary | ICD-10-CM | POA: Diagnosis not present

## 2024-01-05 DIAGNOSIS — Z853 Personal history of malignant neoplasm of breast: Secondary | ICD-10-CM | POA: Diagnosis not present

## 2024-01-05 DIAGNOSIS — Z90722 Acquired absence of ovaries, bilateral: Secondary | ICD-10-CM | POA: Diagnosis not present

## 2024-01-05 DIAGNOSIS — N951 Menopausal and female climacteric states: Secondary | ICD-10-CM | POA: Diagnosis not present

## 2024-01-05 DIAGNOSIS — Z9229 Personal history of other drug therapy: Secondary | ICD-10-CM | POA: Diagnosis not present

## 2024-02-29 ENCOUNTER — Encounter: Payer: Self-pay | Admitting: *Deleted

## 2024-04-27 DIAGNOSIS — F419 Anxiety disorder, unspecified: Secondary | ICD-10-CM | POA: Diagnosis not present

## 2024-04-27 DIAGNOSIS — E782 Mixed hyperlipidemia: Secondary | ICD-10-CM | POA: Diagnosis not present

## 2024-04-27 DIAGNOSIS — Z0000 Encounter for general adult medical examination without abnormal findings: Secondary | ICD-10-CM | POA: Diagnosis not present
